# Patient Record
Sex: Male | Born: 1940 | Race: White | Hispanic: No | Marital: Married | Smoking: Former smoker
Health system: Southern US, Community
[De-identification: ages and names within clinical notes are randomized; demographics above are authoritative.]

## PROBLEM LIST (undated history)

## (undated) DIAGNOSIS — J45909 Unspecified asthma, uncomplicated: Secondary | ICD-10-CM

## (undated) DIAGNOSIS — I10 Essential (primary) hypertension: Secondary | ICD-10-CM

## (undated) DIAGNOSIS — M1990 Unspecified osteoarthritis, unspecified site: Secondary | ICD-10-CM

## (undated) DIAGNOSIS — M545 Low back pain: Secondary | ICD-10-CM

## (undated) DIAGNOSIS — E785 Hyperlipidemia, unspecified: Secondary | ICD-10-CM

## (undated) DIAGNOSIS — K219 Gastro-esophageal reflux disease without esophagitis: Secondary | ICD-10-CM

## (undated) DIAGNOSIS — Z86010 Personal history of colonic polyps: Secondary | ICD-10-CM

## (undated) DIAGNOSIS — R51 Headache: Secondary | ICD-10-CM

## (undated) DIAGNOSIS — E119 Type 2 diabetes mellitus without complications: Secondary | ICD-10-CM

## (undated) HISTORY — DX: Hyperlipidemia, unspecified: E78.5

## (undated) HISTORY — PX: TOTAL KNEE ARTHROPLASTY: SHX125

## (undated) HISTORY — DX: Low back pain: M54.5

## (undated) HISTORY — PX: COLONOSCOPY: SHX174

## (undated) HISTORY — PX: JOINT REPLACEMENT: SHX530

## (undated) HISTORY — DX: Personal history of colonic polyps: Z86.010

## (undated) HISTORY — DX: Essential (primary) hypertension: I10

## (undated) HISTORY — DX: Unspecified asthma, uncomplicated: J45.909

## (undated) HISTORY — DX: Morbid (severe) obesity due to excess calories: E66.01

## (undated) HISTORY — DX: Unspecified osteoarthritis, unspecified site: M19.90

---
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
## 1974-10-01 HISTORY — PX: TRIGEMINAL NERVE DECOMPRESSION: SHX2579

## 2005-01-15 ENCOUNTER — Encounter: Admission: RE | Admit: 2005-01-15 | Discharge: 2005-01-15 | Payer: Self-pay | Admitting: Sports Medicine

## 2005-02-15 ENCOUNTER — Encounter: Admission: RE | Admit: 2005-02-15 | Discharge: 2005-02-15 | Payer: Self-pay | Admitting: Sports Medicine

## 2005-03-05 ENCOUNTER — Encounter: Admission: RE | Admit: 2005-03-05 | Discharge: 2005-03-05 | Payer: Self-pay | Admitting: Sports Medicine

## 2005-03-26 ENCOUNTER — Encounter: Admission: RE | Admit: 2005-03-26 | Discharge: 2005-03-26 | Payer: Self-pay | Admitting: Sports Medicine

## 2008-08-17 ENCOUNTER — Encounter: Admission: RE | Admit: 2008-08-17 | Discharge: 2008-08-17 | Payer: Self-pay | Admitting: Orthopedic Surgery

## 2009-04-26 ENCOUNTER — Inpatient Hospital Stay (HOSPITAL_COMMUNITY): Admission: RE | Admit: 2009-04-26 | Discharge: 2009-04-28 | Payer: Self-pay | Admitting: Orthopedic Surgery

## 2009-10-01 HISTORY — PX: LUMBAR LAMINECTOMY: SHX95

## 2009-11-02 ENCOUNTER — Inpatient Hospital Stay (HOSPITAL_COMMUNITY): Admission: RE | Admit: 2009-11-02 | Discharge: 2009-11-04 | Payer: Self-pay | Admitting: Specialist

## 2010-06-19 ENCOUNTER — Ambulatory Visit: Payer: Self-pay | Admitting: Internal Medicine

## 2010-06-19 DIAGNOSIS — Z86010 Personal history of colonic polyps: Secondary | ICD-10-CM

## 2010-06-19 DIAGNOSIS — M1990 Unspecified osteoarthritis, unspecified site: Secondary | ICD-10-CM

## 2010-06-19 DIAGNOSIS — J45909 Unspecified asthma, uncomplicated: Secondary | ICD-10-CM

## 2010-06-19 DIAGNOSIS — R51 Headache: Secondary | ICD-10-CM

## 2010-06-19 DIAGNOSIS — M545 Low back pain, unspecified: Secondary | ICD-10-CM

## 2010-06-19 DIAGNOSIS — E785 Hyperlipidemia, unspecified: Secondary | ICD-10-CM

## 2010-06-19 DIAGNOSIS — R519 Headache, unspecified: Secondary | ICD-10-CM | POA: Insufficient documentation

## 2010-06-19 DIAGNOSIS — I10 Essential (primary) hypertension: Secondary | ICD-10-CM

## 2010-06-19 HISTORY — DX: Essential (primary) hypertension: I10

## 2010-06-19 HISTORY — DX: Hyperlipidemia, unspecified: E78.5

## 2010-06-19 HISTORY — DX: Low back pain, unspecified: M54.50

## 2010-06-19 HISTORY — DX: Unspecified osteoarthritis, unspecified site: M19.90

## 2010-06-19 HISTORY — DX: Personal history of colonic polyps: Z86.010

## 2010-06-19 LAB — CONVERTED CEMR LAB
Alkaline Phosphatase: 71 units/L (ref 39–117)
Basophils Relative: 0.4 % (ref 0.0–3.0)
CO2: 29 meq/L (ref 19–32)
Chloride: 100 meq/L (ref 96–112)
Cholesterol: 165 mg/dL (ref 0–200)
Creatinine, Ser: 1 mg/dL (ref 0.4–1.5)
Eosinophils Absolute: 0.2 10*3/uL (ref 0.0–0.7)
Eosinophils Relative: 1.8 % (ref 0.0–5.0)
Glucose, Bld: 99 mg/dL (ref 70–99)
HCT: 40.6 % (ref 39.0–52.0)
Lymphocytes Relative: 12.2 % (ref 12.0–46.0)
Lymphs Abs: 1.4 10*3/uL (ref 0.7–4.0)
MCV: 93.9 fL (ref 78.0–100.0)
Neutro Abs: 8.7 10*3/uL — ABNORMAL HIGH (ref 1.4–7.7)
RBC: 4.32 M/uL (ref 4.22–5.81)

## 2010-08-18 ENCOUNTER — Encounter: Payer: Self-pay | Admitting: Internal Medicine

## 2010-09-18 ENCOUNTER — Ambulatory Visit: Payer: Self-pay | Admitting: Internal Medicine

## 2010-10-31 NOTE — Medication Information (Signed)
Summary: No PA required for Simvastatin  No PA required for Simvastatin   Imported By: Maryln Gottron 08/23/2010 12:46:05  _____________________________________________________________________  External Attachment:    Type:   Image     Comment:   External Document

## 2010-10-31 NOTE — Assessment & Plan Note (Signed)
Summary: new to est//cm   Vital Signs:  Patient profile:   70 year old male Height:      69 inches Weight:      269 pounds Temp:     98.3 degrees F oral Pulse rate:   80 / minute Pulse rhythm:   regular Resp:     18 per minute BP sitting:   160 / 80  (left arm) Cuff size:   large  Vitals Entered By: Duard Brady LPN (June 19, 2010 1:58 PM) CC: new to establish Is Patient Diabetic? No   CC:  new to establish.  History of Present Illness: 70 year old patient who is in today to establish with our practice.  Medical problems include  morbid.  Obesity, arthritis, hypertension, and dyslipidemia.  He also is a history of colonic polyps.  Here for Medicare AWV:  1.   Risk factors based on Past M, S, F history:  cardiovascular risk factors include hypertension, dyslipidemia 2.   Physical Activities: plays golf once or twice weekly 3.   Depression/mood: history depression, or mood disorder 4.   Hearing: no hearing deficits 5.   ADL's: independent in all aspects living 6.   Fall Risk: low 7.   Home Safety: no problems  identifying 8.   Height, weight, &visual acuity:height and weight stable.  No change in visual acuity 9.   Counseling: weight loss, more regular exercise encouraged 10.   Labs ordered based on risk factors: laboratory profile, including PSA will be reviewed 11.           Referral Coordination- follow GI in 4 years for follow-up colonoscopy 12.           Care Plan- heart healthy diet, exercise, weight loss.  All encouraged 13.            Cognitive Assessment- alert and oriented, with normal affect; no memory dysfunction, able to handle all executive functions without difficulty   Allergies (verified): 1)  ! Morphine  Past History:  Past Medical History: Asthma Headache Hyperlipidemia Hypertension Low back pain morbid obesity Colonic polyps, hx of Osteoarthritis  Past Surgical History: Total knee replacement 2010 Charlann Boxer) Lumbar laminectomy 2011  ([facility]) Tonsillectomy  Family History: Reviewed history and no changes required. Father died age 72 CAD Mother died 47s SDAT 4 B, 1 S- one died accidental  Social History: Reviewed history and no changes required. Retired Psychologist, clinical  Married  Review of Systems  The patient denies anorexia, fever, weight loss, weight gain, vision loss, decreased hearing, hoarseness, chest pain, syncope, dyspnea on exertion, peripheral edema, prolonged cough, headaches, hemoptysis, abdominal pain, melena, hematochezia, severe indigestion/heartburn, hematuria, incontinence, genital sores, muscle weakness, suspicious skin lesions, transient blindness, difficulty walking, depression, unusual weight change, abnormal bleeding, enlarged lymph nodes, angioedema, breast masses, and testicular masses.    Physical Exam  General:   morbidly obese; blood pressure 160/74 Head:  Normocephalic and atraumatic without obvious abnormalities. No apparent alopecia or balding. Eyes:  No corneal or conjunctival inflammation noted. EOMI. Perrla. Funduscopic exam benign, without hemorrhages, exudates or papilledema. Vision grossly normal. Ears:  External ear exam shows no significant lesions or deformities.  Otoscopic examination reveals clear canals, tympanic membranes are intact bilaterally without bulging, retraction, inflammation or discharge. Hearing is grossly normal bilaterally. Nose:  External nasal examination shows no deformity or inflammation. Nasal mucosa are pink and moist without lesions or exudates. Mouth:  Oral mucosa and oropharynx without lesions or exudates.  Teeth in good repair. Neck:  No deformities,  masses, or tenderness noted. Chest Wall:  No deformities, masses, tenderness or gynecomastia noted. Breasts:  No masses or gynecomastia noted Lungs:  Normal respiratory effort, chest expands symmetrically. Lungs are clear to auscultation, no crackles or wheezes. Heart:  Normal rate and regular rhythm. S1 and  S2 normal without gallop, murmur, click, rub or other extra sounds. Abdomen:  Bowel sounds positive,abdomen soft and non-tender without masses, organomegaly  ventral hernia Rectal:  No external abnormalities noted. Normal sphincter tone. No rectal masses or tenderness. Genitalia:  Testes bilaterally descended without nodularity, tenderness or masses. No scrotal masses or lesions. No penis lesions or urethral discharge. Prostate:   2+ enlarged.  1+ enlarged and 2+ enlarged.   Msk:  No deformity or scoliosis noted of thoracic or lumbar spine.   Pulses:  R and L carotid,radial,femoral,dorsalis pedis and posterior tibial pulses are full and equal bilaterally Extremities:  No clubbing, cyanosis, edema, or deformity noted with normal full range of motion of all joints.   Neurologic:  No cranial nerve deficits noted. Station and gait are normal. Plantar reflexes are down-going bilaterally. DTRs are symmetrical throughout. Sensory, motor and coordinative functions appear intact. Skin:  Intact without suspicious lesions or rashes Cervical Nodes:  No lymphadenopathy noted Axillary Nodes:  No palpable lymphadenopathy Inguinal Nodes:  No significant adenopathy Psych:  Cognition and judgment appear intact. Alert and cooperative with normal attention span and concentration. No apparent delusions, illusions, hallucinations   Impression & Recommendations:  Problem # 1:  Preventive Health Care (ICD-V70.0)  Orders: Medicare -1st Annual Wellness Visit [phone]) Venipuncture (57846) TLB-BMP (Basic Metabolic Panel-BMET) (80048-METABOL) TLB-CBC Platelet - w/Differential (85025-CBCD) TLB-Hepatic/Liver Function Pnl (80076-HEPATIC) TLB-TSH (Thyroid Stimulating Hormone) (84443-TSH) TLB-Cholesterol, Total (82465-CHO) TLB-PSA (Prostate Specific Antigen) (84153-PSA) Specimen Handling (96295)  Complete Medication List: 1)  Lisinopril 40 Mg Tabs (Lisinopril) .... Qd 2)  Simvastatin 40 Mg Tabs (Simvastatin) ....  Qd 3)  Diltiazem Hcl 120 Mg Tabs (Diltiazem hcl) .... 2 by mouth qam and 1po qpm 4)  Nabumetone 750 Mg Tabs (Nabumetone) .Marland Kitchen.. 1 by mouth bid 5)  Multivitamins Tabs (Multiple vitamin) .... Qd 6)  Hydrochlorothiazide 25 Mg Tabs (Hydrochlorothiazide) .... One daily  Patient Instructions: 1)  Please schedule a follow-up appointment in 3 months. 2)  Limit your Sodium (Salt). 3)  It is important that you exercise regularly at least 20 minutes 5 times a week. If you develop chest pain, have severe difficulty breathing, or feel very tired , stop exercising immediately and seek medical attention. 4)  You need to lose weight. Consider a lower calorie diet and regular exercise.  5)  Check your Blood Pressure regularly. If it is above:  150/90 you should make an appointment. Prescriptions: HYDROCHLOROTHIAZIDE 25 MG TABS (HYDROCHLOROTHIAZIDE) one daily  #90 x 4   Entered and Authorized by:   Gordy Savers  MD   Signed by:   Gordy Savers  MD on 06/19/2010   Method used:   Print then Give to Patient   RxID:   [id number] METOPROLOL TARTRATE 50 MG TABS (METOPROLOL TARTRATE) qd  #90 x 4   Entered and Authorized by:   Gordy Savers  MD   Signed by:   Gordy Savers  MD on 06/19/2010   Method used:   Print then Give to Patient   RxID:   [id number] NABUMETONE 750 MG TABS (NABUMETONE) 1 by mouth bid  #180 x 4   Entered and Authorized by:   Gordy Savers  MD   Signed  by:   Gordy Savers  MD on 06/19/2010   Method used:   Print then Give to Patient   RxID:   [id number] DILTIAZEM HCL 120 MG TABS (DILTIAZEM HCL) 2 by mouth qam and 1po qpm  #270 x 5   Entered and Authorized by:   Gordy Savers  MD   Signed by:   Gordy Savers  MD on 06/19/2010   Method used:   Print then Give to Patient   RxID:   [id number] SIMVASTATIN 40 MG TABS (SIMVASTATIN) qd  #90 x 4   Entered and Authorized by:   Gordy Savers  MD   Signed by:   Gordy Savers  MD on 06/19/2010   Method used:   Print then Give to Patient   RxID:   [id number] LISINOPRIL 40 MG TABS (LISINOPRIL) qd  #90 x 4   Entered and Authorized by:   Gordy Savers  MD   Signed by:   Gordy Savers  MD on 06/19/2010   Method used:   Print then Give to Patient   RxID:   [id number]    Immunization History:  Influenza Immunization History:    Influenza:  Historical (06/01/2010)

## 2010-11-02 NOTE — Assessment & Plan Note (Signed)
Summary: 3 month rov/njr   Vital Signs:  Patient profile:   70 year old male Weight:      269 pounds Temp:     98.4 degrees F oral BP sitting:   140 / 70  (left arm) Cuff size:   large  Vitals Entered By: Duard Brady LPN (September 18, 2010 1:00 PM) CC: 3 mos rov - doing well Is Patient Diabetic? No   CC:  3 mos rov - doing well.  History of Present Illness: 70 year old patient who is seen today for follow-up.  He has a history of treated hypertension and dyslipidemia.  Laboratory studies were checked 3 months ago, and these were again reviewed.  His blood pressure remains well controlled and he does monitor readings at home.  He is asymptomatic.  Allergies: 1)  ! Morphine  Past History:  Past Medical History: Reviewed history from 06/19/2010 and no changes required. Asthma Headache Hyperlipidemia Hypertension Low back pain morbid obesity Colonic polyps, hx of Osteoarthritis  Review of Systems  The patient denies anorexia, fever, weight loss, weight gain, vision loss, decreased hearing, hoarseness, chest pain, syncope, dyspnea on exertion, peripheral edema, prolonged cough, headaches, hemoptysis, abdominal pain, melena, hematochezia, severe indigestion/heartburn, hematuria, incontinence, genital sores, muscle weakness, suspicious skin lesions, transient blindness, difficulty walking, depression, unusual weight change, abnormal bleeding, enlarged lymph nodes, angioedema, breast masses, and testicular masses.    Physical Exam  General:  overweight-appearing.  130/64overweight-appearing.   Head:  Normocephalic and atraumatic without obvious abnormalities. No apparent alopecia or balding. Mouth:  Oral mucosa and oropharynx without lesions or exudates.  Teeth in good repair. Neck:  No deformities, masses, or tenderness noted. Lungs:  Normal respiratory effort, chest expands symmetrically. Lungs are clear to auscultation, no crackles or wheezes. Heart:  Normal rate and  regular rhythm. S1 and S2 normal without gallop, murmur, click, rub or other extra sounds. Abdomen:  Bowel sounds positive,abdomen soft and non-tender without masses, organomegaly or hernias noted. Msk:  No deformity or scoliosis noted of thoracic or lumbar spine.   Pulses:  R and L carotid,radial,femoral,dorsalis pedis and posterior tibial pulses are full and equal bilaterally Extremities:  No clubbing, cyanosis, edema, or deformity noted with normal full range of motion of all joints.     Impression & Recommendations:  Problem # 1:  HYPERTENSION (ICD-401.9)  His updated medication list for this problem includes:    Lisinopril 40 Mg Tabs (Lisinopril) ..... Qd    Diltiazem Hcl 120 Mg Tabs (Diltiazem hcl) .Marland Kitchen... 2 by mouth qam and 1po qpm    Hydrochlorothiazide 25 Mg Tabs (Hydrochlorothiazide) ..... One daily  His updated medication list for this problem includes:    Lisinopril 40 Mg Tabs (Lisinopril) ..... Qd    Diltiazem Hcl 120 Mg Tabs (Diltiazem hcl) .Marland Kitchen... 2 by mouth qam and 1po qpm    Hydrochlorothiazide 25 Mg Tabs (Hydrochlorothiazide) ..... One daily  Problem # 2:  HYPERLIPIDEMIA (ICD-272.4)  His updated medication list for this problem includes:    Simvastatin 40 Mg Tabs (Simvastatin) ..... Qd  His updated medication list for this problem includes:    Simvastatin 40 Mg Tabs (Simvastatin) ..... Qd  Complete Medication List: 1)  Lisinopril 40 Mg Tabs (Lisinopril) .... Qd 2)  Simvastatin 40 Mg Tabs (Simvastatin) .... Qd 3)  Diltiazem Hcl 120 Mg Tabs (Diltiazem hcl) .... 2 by mouth qam and 1po qpm 4)  Nabumetone 750 Mg Tabs (Nabumetone) .Marland Kitchen.. 1 by mouth bid 5)  Multivitamins Tabs (Multiple vitamin) .Marland KitchenMarland KitchenMarland Kitchen  Qd 6)  Hydrochlorothiazide 25 Mg Tabs (Hydrochlorothiazide) .... One daily  Patient Instructions: 1)  Please schedule a follow-up appointment in 6 months. 2)  Limit your Sodium (Salt). 3)  It is important that you exercise regularly at least 20 minutes 5 times a week. If you  develop chest pain, have severe difficulty breathing, or feel very tired , stop exercising immediately and seek medical attention. 4)  You need to lose weight. Consider a lower calorie diet and regular exercise.  5)  Check your Blood Pressure regularly. If it is above: 150/90 you should make an appointment.   Orders Added: 1)  Est. Patient Level III [95621]

## 2010-12-17 LAB — COMPREHENSIVE METABOLIC PANEL
ALT: 19 U/L (ref 0–53)
AST: 17 U/L (ref 0–37)
BUN: 21 mg/dL (ref 6–23)
Calcium: 9.3 mg/dL (ref 8.4–10.5)
Chloride: 101 mEq/L (ref 96–112)
Creatinine, Ser: 1.01 mg/dL (ref 0.4–1.5)
GFR calc Af Amer: >60 mL/min (ref >60)
Potassium: 4.4 mEq/L (ref 3.5–5.1)
Sodium: 140 mEq/L (ref 135–145)
Total Bilirubin: 0.9 mg/dL (ref 0.3–1.2)
Total Protein: 7 g/dL (ref 6.0–8.3)

## 2010-12-17 LAB — URINALYSIS, ROUTINE W REFLEX MICROSCOPIC
Hgb urine dipstick: NEGATIVE
Protein, ur: 100 mg/dL — AB
Specific Gravity, Urine: 1.027 (ref 1.005–1.030)

## 2010-12-17 LAB — CBC
MCHC: 35.1 g/dL (ref 30.0–36.0)
RDW: 12.3 % (ref 11.5–15.5)

## 2010-12-17 LAB — DIFFERENTIAL
Basophils Relative: 1 % (ref 0–1)
Lymphocytes Relative: 12 % (ref 12–46)
Lymphs Abs: 1.1 10*3/uL (ref 0.7–4.0)
Monocytes Absolute: 0.7 10*3/uL (ref 0.1–1.0)
Neutro Abs: 6.4 10*3/uL (ref 1.7–7.7)
Neutrophils Relative %: 76 % (ref 43–77)

## 2010-12-17 LAB — PROTIME-INR
INR: 1.03 (ref 0.00–1.49)
Prothrombin Time: 13.4 seconds (ref 11.6–15.2)

## 2010-12-17 LAB — URINE MICROSCOPIC-ADD ON

## 2010-12-17 LAB — APTT: aPTT: 28 seconds (ref 24–37)

## 2010-12-20 LAB — BASIC METABOLIC PANEL
CO2: 29 mEq/L (ref 19–32)
Chloride: 102 mEq/L (ref 96–112)
GFR calc Af Amer: >60 mL/min (ref >60)
GFR calc non Af Amer: >60 mL/min (ref >60)
Potassium: 4.4 mEq/L (ref 3.5–5.1)

## 2010-12-20 LAB — CBC
HCT: 34.6 % — ABNORMAL LOW (ref 39.0–52.0)
MCHC: 34.9 g/dL (ref 30.0–36.0)
MCV: 93.3 fL (ref 78.0–100.0)
RBC: 3.71 MIL/uL — ABNORMAL LOW (ref 4.22–5.81)
RDW: 13.2 % (ref 11.5–15.5)

## 2010-12-20 LAB — TYPE AND SCREEN

## 2011-01-07 LAB — CBC
HCT: 32 % — ABNORMAL LOW (ref 39.0–52.0)
HCT: 34.1 % — ABNORMAL LOW (ref 39.0–52.0)
HCT: 41.7 % (ref 39.0–52.0)
Hemoglobin: 11.1 g/dL — ABNORMAL LOW (ref 13.0–17.0)
Hemoglobin: 11.8 g/dL — ABNORMAL LOW (ref 13.0–17.0)
MCHC: 34.7 g/dL (ref 30.0–36.0)
MCV: 92.1 fL (ref 78.0–100.0)
Platelets: 191 10*3/uL (ref 150–400)
Platelets: 221 10*3/uL (ref 150–400)
RBC: 3.71 MIL/uL — ABNORMAL LOW (ref 4.22–5.81)
RBC: 4.55 MIL/uL (ref 4.22–5.81)
RDW: 12.8 % (ref 11.5–15.5)
RDW: 13 % (ref 11.5–15.5)
WBC: 9.8 10*3/uL (ref 4.0–10.5)

## 2011-01-07 LAB — DIFFERENTIAL
Basophils Relative: 0 % (ref 0–1)
Eosinophils Absolute: 0.2 10*3/uL (ref 0.0–0.7)
Eosinophils Relative: 3 % (ref 0–5)
Lymphocytes Relative: 12 % (ref 12–46)
Neutro Abs: 7.3 10*3/uL (ref 1.7–7.7)
Neutrophils Relative %: 75 % (ref 43–77)

## 2011-01-07 LAB — BASIC METABOLIC PANEL
BUN: 14 mg/dL (ref 6–23)
BUN: 25 mg/dL — ABNORMAL HIGH (ref 6–23)
CO2: 28 mEq/L (ref 19–32)
CO2: 31 mEq/L (ref 19–32)
Calcium: 8.5 mg/dL (ref 8.4–10.5)
Calcium: 9.6 mg/dL (ref 8.4–10.5)
GFR calc Af Amer: >60 mL/min (ref >60)
GFR calc Af Amer: >60 mL/min (ref >60)
GFR calc non Af Amer: >60 mL/min (ref >60)
Glucose, Bld: 100 mg/dL — ABNORMAL HIGH (ref 70–99)
Glucose, Bld: 130 mg/dL — ABNORMAL HIGH (ref 70–99)
Glucose, Bld: 164 mg/dL — ABNORMAL HIGH (ref 70–99)
Potassium: 4.4 mEq/L (ref 3.5–5.1)
Potassium: 4.7 mEq/L (ref 3.5–5.1)
Sodium: 136 mEq/L (ref 135–145)
Sodium: 138 mEq/L (ref 135–145)
Sodium: 143 mEq/L (ref 135–145)

## 2011-01-07 LAB — URINALYSIS, ROUTINE W REFLEX MICROSCOPIC
Bilirubin Urine: NEGATIVE
Ketones, ur: NEGATIVE mg/dL
Leukocytes, UA: NEGATIVE
Nitrite: NEGATIVE
Protein, ur: 100 mg/dL — AB

## 2011-01-07 LAB — TYPE AND SCREEN
ABO/RH(D): O POS
Antibody Screen: NEGATIVE

## 2011-01-07 LAB — PROTIME-INR: Prothrombin Time: 13.4 seconds (ref 11.6–15.2)

## 2011-01-07 LAB — APTT: aPTT: 27 seconds (ref 24–37)

## 2011-02-13 NOTE — H&P (Signed)
NAMEQUINTON, Julian Mcdonald            ACCOUNT NO.:  [id number]   MEDICAL RECORD NO.:  [id number]          PATIENT TYPE:  INP   LOCATION:  NA                           FACILITY:  [facility]   PHYSICIAN:  Madlyn Frankel. Charlann Boxer, M.D.  DATE OF BIRTH:  [birth year]   DATE OF ADMISSION:  04/21/2009  DATE OF DISCHARGE:                              HISTORY & PHYSICAL   PROCEDURE:  Right total knee replacement.   CHIEF COMPLAINTS:  Right knee pain.   HISTORY OF PRESENT ILLNESS:  A 70 year old male with a history of right  knee pain secondary to osteoarthritis, been refractory to all  conservative treatment.   PRIMARY CARE PHYSICIAN:  Dr. Haskel Schroeder with [facility].   PAST MEDICAL HISTORY:  1. Osteoarthritis.  2. Hypertension.   PAST SURGERIES:  1. Tonsillectomy.  2. Trigeminal neuralgia surgery.   FAMILY HISTORY:  Noncontributory.   SOCIAL HISTORY:  Married, nonsmoker.  Primary caregiver will be his wife  Jamesetta So postoperatively.   DRUG ALLERGIES:  MORPHINE.   MEDICATIONS:  1. Multivitamin Centrum Silver p.o. daily.  2. Lisinopril 40 mg p.o. daily.  3. Simvastatin 40 mg p.o. daily.  4. Bystolic 5 mg p.o. daily.  5. Relafen 750 mg p.o. b.i.d.  6. Arthritis Pain Reliever two p.o. daily p.r.n.  7. Hydrocodone 10/660 one p.o. q.6 h p.r.n.   REVIEW OF SYSTEMS:  See HPI.   PHYSICAL EXAMINATION:  VITAL SIGNS:  Pulse 72, respirations 16, blood  pressure 144/90.  GENERAL:  Awake, alert and oriented.  HEENT: Normocephalic.  NECK:  Supple.  No carotid bruits.  CHEST/LUNGS:  Sounds clear to auscultation bilaterally.  BREASTS:  Deferred.  HEART:  S1-S2 distinct.  ABDOMEN:  Soft, bowel sounds present.  PELVIS:  Stable.  GENITOURINARY:  Deferred.  EXTREMITIES:  Right knee has increased pain with weightbearing.  SKIN:  No cellulitis.  NEUROLOGIC:  Intact distal sensibilities.   LABORATORY DATA:  Labs, EKG, chest x-ray all pending presurgical  testing.   IMPRESSION:  Right knee  osteoarthritis.   PLAN:  Plan of action right total knee replacement by Dr. Charlann Boxer at [facility] April 26, 2009.  Risks, complications were discussed.   Postoperative medications were provided including aspirin for DVT  prophylaxis.  He will also be taking Mobic for postoperative swelling  and pain.     ______________________________  Yetta Glassman. [address], [place]      Madlyn Frankel. Charlann Boxer, M.D.  Electronically Signed    BLM/MEDQ  D:  04/20/2009  T:  04/20/2009  Job:  [id number]   cc:   Jocelyn Lamer D. Pecola Leisure, M.D.  Fax: [phone]

## 2011-02-13 NOTE — Op Note (Signed)
NAMEJAKWAN, SALLY            ACCOUNT NO.:  [id number]   MEDICAL RECORD NO.:  [id number]          PATIENT TYPE:  INP   LOCATION:  0003                         FACILITY:  [facility]   PHYSICIAN:  Madlyn Frankel. Charlann Boxer, M.D.  DATE OF BIRTH:  Feb 12, 1941   DATE OF PROCEDURE:  04/26/2009  DATE OF DISCHARGE:                               OPERATIVE REPORT   PREOPERATIVE DIAGNOSIS:  Right knee osteoarthritis.   POSTOPERATIVE DIAGNOSIS:  Right knee osteoarthritis.   PROCEDURE:  Right total knee replacement.   COMPONENTS USED:  DePuy rotating platform posterior stabilized knee  system with a size 5 femur, 5 tibial tray, 10 mm insert and a 41  patellar button.   SURGEON:  Madlyn Frankel. Charlann Boxer, M.D.   ASSISTANT:  Surgical techs.   ANESTHESIA:  Spinal.   DRAINS:  One Hemovac.   COMPLICATIONS:  None.   TOURNIQUET TIME:  50 minutes at 250 mmHg.   FINDINGS:  As above.   SPECIMENS:  None.   INDICATIONS FOR PROCEDURE:  Mr. Friesen is a 70 year old gentleman who  had been following in the office for advanced bilateral knee  osteoarthritis.  He had failed conservative measures and wished to  proceed with arthroplasty at this point.  The risks of infection, DVT,  stiffness and the need for revision surgery were all discussed and  reviewed.  Consent was obtained for the benefit of pain relief.   PROCEDURE IN DETAIL:  The patient was brought to the operative theater.  Once adequate anesthesia, preoperative antibiotics, Ancef administered,  the patient was positioned supine with a proximal thigh tourniquet  placed on the right lower extremity.  The right lower extremity was then  pre-scrubbed, prepped and draped in a sterile fashion.  A timeout was  performed identifying the patient, planned procedure and extremity.   The right foot was placed into the  foot holder and the leg was  exsanguinated and the tourniquet elevated to 250 mmHg.  The leg was  flexed and a midline incision was made.   Following a median arthrotomy  and initial debridement, attention was directed to the patella.  Precut  measurement was 24-25 mm.  I resected down to 14-15 mm and used a 41  patellar button.  The lug holes were drilled and metal shim placed.   Attention was now directed to the femur.  The femoral canal was opened  with a drill, irrigated to prevent fat emboli and intramedullary rod was  passed into 5 degrees of valgus.  I resected 12 mm of bone off the  distal femur due to flexion contracture.   Attention was now directed to the tibia.  The tibia was subluxated  anteriorly and using extramedullary guide, I resected 10 mm of bone off  the proximal lateral tibia.  I then checked to find that the knee came  to extension with the 10 mm insert.  The guidepin was removed.  We then  checked the cut surface to make sure that in the coronal plane that the  cut was perpendicular and it was with the alignment rod.  I then sized  the femur  to be a size 5, fit the rotation based on the proximal tibia  cut using a C clamp and a rotation block.  There was an anterior  reference.  The four-in-one cutting block was then placed.  Anterior,  posterior and chamfer cuts were then made without difficulty or  notching.   The final box cut was made off the lateral aspect of the distal femur.  At this point, we returned back to the tibia.  The tibia cut surface  seemed to fit best with a size 5 tibial tray even after I removed medial  osteophytes further decompressing the MCL, in this varus knee.  Once the  tibial tray was pinned into position, it was drilled, keel punched and a  trial reduction was carried out with a 5 femur, 5 tibia, 10-mm insert  and 41 patellar button which tracked through the trochlea without  application or pressure.   At this point, the trial components were removed.  The final components  were opened.  Final debridements within the posterior aspect of the knee  carried out as  necessary.  Synovial capsule junction was injected with  quarter-percent Marcaine with epinephrine and Toradol.  Once the knee  was irrigated with normal saline solution, the cement was mixed.   The final components were cemented into position.  The 10 mm insert was  inserted and extruded cement was removed.  Once the cement had cured,  excessive cement was removed throughout the knee and the final 10-mm  insert to match the 5 femur was inserted.   At this point, the tourniquet was let down at 50 minutes at 250 mmHg.  A  medium Hemovac drain was placed deep.  We re-irrigated the knee with  normal saline solution.   The extensor mechanism was then reapproximated using #1 Vicryl with the  knee in flexion.  The remaining wound was closed with 2-0 Vicryl and a  running 4-0 Monocryl.  The knee was cleaned, dried and dressed sterilely  with Steri-Strips and a bulky sterile wrap.  He was then brought to the  recovery room in stable condition tolerating the procedure well.      Madlyn Frankel Charlann Boxer, M.D.  Electronically Signed     MDO/MEDQ  D:  04/26/2009  T:  04/26/2009  Job:  [id number]

## 2011-02-16 NOTE — Discharge Summary (Signed)
Julian Mcdonald, Julian Mcdonald            ACCOUNT NO.:  [id number]   MEDICAL RECORD NO.:  [id number]          PATIENT TYPE:  INP   LOCATION:  1605                         [facility]ACILITY:  [facility]   PHYSICIAN:  Madlyn Frankel. Charlann Boxer, M.D.  DATE OF BIRTH:  May 06, 1941   DATE OF ADMISSION:  04/26/2009  DATE OF DISCHARGE:  04/28/2009                               DISCHARGE SUMMARY   ADMISSION DIAGNOSES:  1,  Osteoarthritis.  1. Hypertension.   DISCHARGE DIAGNOSES:  1,  Osteoarthritis.  1. Hypertension.   HISTORY OF PRESENT ILLNESS:  A 70 year old male with a history of right  knee pain secondary to osteoarthritis.   CONSULTATION:  None.   PROCEDURE:  Right total knee replaced by surgeon Dr. Durene Romans.   LABORATORY DATA:  CBC final reading:  White blood cell 9.5, hemoglobin  11.1, hematocrit 32, platelets 191,000.  Metabolic:  Sodium 138,  potassium 4.7, BUN 14, creatinine 0.93, glucose 130.   HOSPITAL COURSE:  The patient admitted to hospital, underwent right  total knee replacement.  Tolerated procedure well.  His stay was  unremarkable.  He remained hemodynamically orthopedically stable.  Wound  dressing was changed after day #2.  No significant drainage from wound.  He had improving quad function was weightbearing as tolerated with  physical therapy and by day #2, met all criteria for discharge home with  home health care PT.   DISCHARGE DISPOSITION:  Discharged home stable, improved condition with  home health care PT.   DISCHARGE DIET:  Heart-healthy.   WOUND CARE:  Keep dry.   DISCHARGE MEDICATIONS:  1. Oxycodone 5 mg 1-3 tabs q. 3-5 p.r.n. pain.  2. Robaxin 500 mg p.o. q.6 p.r.n. muscle spasm pain.  3. Aspirin 325 mg p.o. b.i.d. for 6 weeks.  4. Iron 325 mg t.i.d. x2 weeks.  5. Colace 100 mg p.o. b.i.d. p.r.n.  6. Multivitamin daily.  7. Bioflex daily.  8. Bystolic 5 mg one p.o. daily.  9. Lisinopril 40 mg one p.o. daily.  10.Simvastatin 40 mg one p.o. daily.  11.Celebrex 2 mg  one p.o. b.i.d. x2 weeks.   DISCHARGE FOLLOWUP:  Follow up with Dr. Charlann Boxer at phone [phone] in 2  weeks for wound check.   DISCHARGE PHYSICAL THERAPY:  Home health care PT with goals in the range  of 0-120.     ______________________________  Julian Mcdonald, Julian Mcdonald      Madlyn Frankel. Charlann Boxer, M.D.  Electronically Signed    BLM/MEDQ  D:  05/16/2009  T:  05/16/2009  Job:  [id number]   cc:   Jocelyn Lamer D. Pecola Leisure, M.D.  Fax: [phone]

## 2011-03-02 ENCOUNTER — Encounter: Payer: Self-pay | Admitting: Internal Medicine

## 2011-03-05 ENCOUNTER — Encounter: Payer: Self-pay | Admitting: Internal Medicine

## 2011-03-05 ENCOUNTER — Ambulatory Visit (INDEPENDENT_AMBULATORY_CARE_PROVIDER_SITE_OTHER): Payer: Medicare Other | Admitting: Internal Medicine

## 2011-03-05 DIAGNOSIS — M1990 Unspecified osteoarthritis, unspecified site: Secondary | ICD-10-CM

## 2011-03-05 DIAGNOSIS — I10 Essential (primary) hypertension: Secondary | ICD-10-CM

## 2011-03-05 DIAGNOSIS — E785 Hyperlipidemia, unspecified: Secondary | ICD-10-CM

## 2011-03-05 NOTE — Patient Instructions (Signed)

## 2011-03-05 NOTE — Progress Notes (Signed)
  Subjective:    Patient ID: Julian Mcdonald, male    DOB: [birth year], 70 y.o.   MRN: [id number]  HPI  70 year old patient who is seen today for followup. He has a history of hypertension dyslipidemia and osteoarthritis. Is also seen today for medical clearance for a total left knee replacement surgery. He is doing quite well. He is active with golf and does reasonably well does have some occasional low back pain as well as his chronic left knee pain. He denies any exertional chest pain or shortness of breath there is a remote history of asthma that has been stable. His medical regimen reviewed.    Review of Systems  Constitutional: Negative for fever, chills, appetite change and fatigue.  HENT: Negative for hearing loss, ear pain, congestion, sore throat, trouble swallowing, neck stiffness, dental problem, voice change and tinnitus.   Eyes: Negative for pain, discharge and visual disturbance.  Respiratory: Negative for cough, chest tightness, wheezing and stridor.   Cardiovascular: Negative for chest pain, palpitations and leg swelling.  Gastrointestinal: Negative for nausea, vomiting, abdominal pain, diarrhea, constipation, blood in stool and abdominal distention.  Genitourinary: Negative for urgency, hematuria, flank pain, discharge, difficulty urinating and genital sores.  Musculoskeletal: Negative for myalgias, back pain, joint swelling, arthralgias and gait problem.  Skin: Negative for rash.  Neurological: Negative for dizziness, syncope, speech difficulty, weakness, numbness and headaches.  Hematological: Negative for adenopathy. Does not bruise/bleed easily.  Psychiatric/Behavioral: Negative for behavioral problems and dysphoric mood. The patient is not nervous/anxious.        Objective:   Physical Exam  Constitutional: He is oriented to person, place, and time. He appears well-developed.  HENT:  Head: Normocephalic.  Right Ear: External ear normal.  Left Ear: External ear  normal.  Eyes: Conjunctivae and EOM are normal.  Neck: Normal range of motion.  Cardiovascular: Normal rate and normal heart sounds.   Pulmonary/Chest: Breath sounds normal.  Abdominal: Bowel sounds are normal.  Musculoskeletal: Normal range of motion. He exhibits no edema and no tenderness.  Neurological: He is alert and oriented to person, place, and time.  Psychiatric: He has a normal mood and affect. His behavior is normal.          Assessment & Plan:   Hypertension well controlled Osteoarthritis left knee Dyslipidemia Exogenous obesity  The patient is medically stable for orthopedic surgery. Weight loss encouraged. He denies any exertional chest pain or shortness of breath and his cardiopulmonary status is stable. No preoperative evaluation is deemed necessary. He'll return here in 6 months for an annual exam

## 2011-03-26 ENCOUNTER — Encounter (HOSPITAL_COMMUNITY): Payer: Medicare Other

## 2011-03-26 ENCOUNTER — Other Ambulatory Visit: Payer: Self-pay | Admitting: Orthopedic Surgery

## 2011-03-26 ENCOUNTER — Other Ambulatory Visit (HOSPITAL_COMMUNITY): Payer: Self-pay | Admitting: Orthopedic Surgery

## 2011-03-26 ENCOUNTER — Ambulatory Visit (HOSPITAL_COMMUNITY)
Admission: RE | Admit: 2011-03-26 | Discharge: 2011-03-26 | Disposition: A | Discharge status: Home / Self Care | Payer: Medicare Other | Source: Ambulatory Visit | Attending: Orthopedic Surgery | Admitting: Orthopedic Surgery

## 2011-03-26 DIAGNOSIS — Z01812 Encounter for preprocedural laboratory examination: Secondary | ICD-10-CM | POA: Insufficient documentation

## 2011-03-26 DIAGNOSIS — Z01818 Encounter for other preprocedural examination: Secondary | ICD-10-CM

## 2011-03-26 DIAGNOSIS — R9431 Abnormal electrocardiogram [ECG] [EKG]: Secondary | ICD-10-CM | POA: Insufficient documentation

## 2011-03-26 DIAGNOSIS — M47814 Spondylosis without myelopathy or radiculopathy, thoracic region: Secondary | ICD-10-CM | POA: Insufficient documentation

## 2011-03-26 DIAGNOSIS — M1710 Unilateral primary osteoarthritis, unspecified knee: Secondary | ICD-10-CM | POA: Insufficient documentation

## 2011-03-26 DIAGNOSIS — Z01810 Encounter for preprocedural cardiovascular examination: Secondary | ICD-10-CM | POA: Insufficient documentation

## 2011-03-26 LAB — BASIC METABOLIC PANEL WITH GFR
BUN: 26 mg/dL — ABNORMAL HIGH (ref 6–23)
CO2: 29 meq/L (ref 19–32)
Calcium: 10.3 mg/dL (ref 8.4–10.5)
Chloride: 100 meq/L (ref 96–112)
Creatinine, Ser: 1.15 mg/dL (ref 0.50–1.35)
GFR calc Af Amer: >60 mL/min
GFR calc non Af Amer: >60 mL/min
Glucose, Bld: 125 mg/dL — ABNORMAL HIGH (ref 70–99)
Potassium: 4.1 meq/L (ref 3.5–5.1)
Sodium: 139 meq/L (ref 135–145)

## 2011-03-26 LAB — URINALYSIS, ROUTINE W REFLEX MICROSCOPIC
Bilirubin Urine: NEGATIVE
Glucose, UA: NEGATIVE mg/dL
Hgb urine dipstick: NEGATIVE
Ketones, ur: NEGATIVE mg/dL
Leukocytes, UA: NEGATIVE
Nitrite: NEGATIVE
Protein, ur: 30 mg/dL — AB
Specific Gravity, Urine: 1.025 (ref 1.005–1.030)
Urobilinogen, UA: 1 mg/dL (ref 0.0–1.0)
pH: 5.5 (ref 5.0–8.0)

## 2011-03-26 LAB — URINE MICROSCOPIC-ADD ON

## 2011-03-26 LAB — CBC
MCH: 30.6 pg (ref 26.0–34.0)
MCHC: 33.8 g/dL (ref 30.0–36.0)
Platelets: 270 10*3/uL (ref 150–400)
RDW: 12.7 % (ref 11.5–15.5)

## 2011-03-26 LAB — DIFFERENTIAL
Basophils Absolute: 0.1 10*3/uL (ref 0.0–0.1)
Basophils Relative: 1 % (ref 0–1)
Eosinophils Absolute: 0.5 10*3/uL (ref 0.0–0.7)
Monocytes Relative: 9 % (ref 3–12)
Neutro Abs: 8.1 10*3/uL — ABNORMAL HIGH (ref 1.7–7.7)
Neutrophils Relative %: 73 % (ref 43–77)

## 2011-03-26 LAB — SURGICAL PCR SCREEN
MRSA, PCR: NEGATIVE
Staphylococcus aureus: POSITIVE — AB

## 2011-03-26 LAB — PROTIME-INR
INR: 1.03 (ref 0.00–1.49)
Prothrombin Time: 13.7 s (ref 11.6–15.2)

## 2011-03-26 LAB — APTT: aPTT: 31 s (ref 24–37)

## 2011-04-02 ENCOUNTER — Inpatient Hospital Stay (HOSPITAL_COMMUNITY)
Admission: RE | Admit: 2011-04-02 | Discharge: 2011-04-04 | DRG: 470 | Disposition: A | Discharge status: Home Health | Payer: Medicare Other | Source: Ambulatory Visit | Attending: Orthopedic Surgery | Admitting: Orthopedic Surgery

## 2011-04-02 DIAGNOSIS — M1710 Unilateral primary osteoarthritis, unspecified knee: Principal | ICD-10-CM | POA: Diagnosis present

## 2011-04-02 DIAGNOSIS — Z01812 Encounter for preprocedural laboratory examination: Secondary | ICD-10-CM

## 2011-04-02 DIAGNOSIS — Z96659 Presence of unspecified artificial knee joint: Secondary | ICD-10-CM

## 2011-04-02 DIAGNOSIS — I10 Essential (primary) hypertension: Secondary | ICD-10-CM | POA: Diagnosis present

## 2011-04-02 LAB — TYPE AND SCREEN: Antibody Screen: NEGATIVE

## 2011-04-03 LAB — CBC
MCHC: 35 g/dL (ref 30.0–36.0)
RDW: 12.8 % (ref 11.5–15.5)
WBC: 10.3 10*3/uL (ref 4.0–10.5)

## 2011-04-03 LAB — BASIC METABOLIC PANEL
GFR calc Af Amer: >60 mL/min (ref >60)
GFR calc non Af Amer: >60 mL/min (ref >60)
Potassium: 4.2 mEq/L (ref 3.5–5.1)
Sodium: 133 mEq/L — ABNORMAL LOW (ref 135–145)

## 2011-04-04 LAB — BASIC METABOLIC PANEL
CO2: 27 mEq/L (ref 19–32)
Chloride: 96 mEq/L (ref 96–112)
GFR calc non Af Amer: >60 mL/min (ref >60)
Glucose, Bld: 142 mg/dL — ABNORMAL HIGH (ref 70–99)
Potassium: 3.9 mEq/L (ref 3.5–5.1)
Sodium: 134 mEq/L — ABNORMAL LOW (ref 135–145)

## 2011-04-04 LAB — CBC
Hemoglobin: 11.6 g/dL — ABNORMAL LOW (ref 13.0–17.0)
RBC: 3.81 MIL/uL — ABNORMAL LOW (ref 4.22–5.81)
WBC: 10.4 10*3/uL (ref 4.0–10.5)

## 2011-04-09 NOTE — H&P (Signed)
  Julian Mcdonald, Julian Mcdonald            ACCOUNT NO.:  [id number]  MEDICAL RECORD NO.:  [id number]  LOCATION:  1605                         FACILITY:  [facility]  PHYSICIAN:  Madlyn Frankel. Charlann Boxer, M.D.  DATE OF BIRTH:  1940/10/30  DATE OF ADMISSION:  04/02/2011 DATE OF DISCHARGE:  04/04/2011                             HISTORY & PHYSICAL   ADMISSION DIAGNOSIS:  Left knee osteoarthritis.  HISTORY:  Mr. Julian Mcdonald is a 70 year old patient of mine with history of bilateral knee arthritis status post right total hip replacement in July of 2010.  He has had done very well with this over the long-term and has failed conservative measures in his left knee.  At this point, he wished to proceed with left total knee replacement.  We reviewed the risks and benefits, the hospital course and expectations and consent was obtained based off our discussion today.  PAST MEDICAL HISTORY:  Includes hypertension, osteoarthritis.  PAST SURGICAL HISTORY:  Surgical history includes right total knee replacement, tonsillectomy and trigeminal neuralgia surgery.  FAMILY HISTORY:  Negative for diabetes.  SOCIAL HISTORY:  He is married.  His wife is a patient of ours as well.  DRUG ALLERGIES:  MORPHINE.  CURRENT MEDICATIONS:  Include multivitamins, lisinopril, simvastatin, Relafen as needed, hydrocodone as needed, Bystolic.  PHYSICAL EXAMINATION:  GENERAL:  Examination in the office, he was stable. VITAL SIGNS:  He was afebrile with pulse 72, blood pressure 132/73.  He walks without assistive device.  No wheezing with exertion. HEENT:  Head and neck exam is normal. CHEST:  Clear to auscultation. ABDOMEN:  Soft, nontender but obese. EXTREMITIES:  His right knee incision is well healed with full range of motion and stable ligaments.  His left knee has painful crepitation but only pain medially, some effusion noted.  No signs of infection, no venous lymphatic changes distally.  PREOPERATIVE LABS:  EKG pending  evaluation.  RADIOGRAPHS:  Radiographs of knee were available in our office for evaluation.  ASSESSMENT: 1. Advanced left knee osteoarthritis 2. Status post right total knee replacement.  PLAN:  Mr. Julian Mcdonald will be scheduled for same-day admitting surgery on April 02, 2011.  After reviewing with him the risks and benefits of tranexamic acid, he is a candidate to receive this.  He currently weighs 120 kg.  We will plan for 2- to 3-day hospital stay returning home with home health physical therapy as he has done before.     Madlyn Frankel Charlann Boxer, M.D.     MDO/MEDQ  D:  04/04/2011  T:  04/04/2011  Job:  [id number]  Electronically Signed by Durene Romans M.D. on 04/09/2011 09:13:31 AM

## 2011-04-09 NOTE — Op Note (Signed)
Julian Mcdonald, RAGSDALE            ACCOUNT NO.:  [id number]  MEDICAL RECORD NO.:  [id number]  LOCATION:  1605                         FACILITY:  [facility]  PHYSICIAN:  Madlyn Frankel. Charlann Boxer, M.D.  DATE OF BIRTH:  1941/07/15  DATE OF PROCEDURE:  04/02/2011 DATE OF DISCHARGE:                              OPERATIVE REPORT   PREOPERATIVE DIAGNOSIS:  Left knee osteoarthritis.  POSTOPERATIVE DIAGNOSIS:  Left knee osteoarthritis.  PROCEDURE:  Left total knee replacement.  COMPONENTS USED:  DePuy rotating platform posterior stabilized knee system with size 5 femur, 4 tibia, 10-mm insert and a 41 patellar button.  SURGEON:  Madlyn Frankel. Charlann Boxer, M.D.  ASSISTANT:  Surgical team.  ANESTHESIA:  Preoperative regional femoral nerve block, plus general anesthetic.  SPECIMENS:  None.  COMPLICATIONS:  None.  DRAINS:  One Hemovac.  TOURNIQUET TIME:  39 minutes at 250 mmHg.  INDICATIONS FOR PROCEDURE:  Mr. Brockbank is a 70 year old gentleman with history of bilateral knee osteoarthritis with history of right total knee replacement, he had been very pleased with.  His left knee at this point has failed conservative measures and he is ready to proceed with total knee replacement on this side.  Risks and benefits, and the postoperative course reviewed.  Consent was obtained for the benefit of pain relief.  PROCEDURE IN DETAIL:  The patient was brought to operative theater. Once adequate anesthesia, preoperative antibiotics, Ancef administered, he was positioned supine with a left thigh tourniquet placed.  The left lower extremity was then prepped and draped in sterile fashion with left foot placed in DeMayo leg holder.  Time-out was performed identifying the patient, planned procedure and extremity.  Leg was exsanguinated, tourniquet elevated to 250 mmHg.  Following this, a midline incision was made followed by median parapatellar arthrotomy.  Following median arthrotomy initial exposure,  attention was first directed to patella, precut measure was 26 mm.  I resected down to about 14-15 mm.  Lug holes were drilled for 41 patellar button to cover the cut surface and protect the surface from retractors.  A metal shim was placed.  Attention was now directed to the femur.  Femoral canal was opened a drill irrigated to try to prevent fat emboli.  An intramedullary rod was passed and 5 degrees of valgus, 10 mm bone resected off the distal femur.  Following this resection, the tibia was subluxated anteriorly. Using extramedullary guide, measured resection of 10 mm of proximal lateral tibia was made.  I confirmed the gap would be stable with a 10 mm insert medially and laterally.  We then also confirmed the cut was perpendicular in the coronal plane.  At this point, I sized the femur to be a size 5.  The size 5 rotation block was pinned anterior reference using a C clamp set rotation.  The former cutting block was then exchanged, the anterior-posterior chamfer cuts made without difficulty nor notching.  The tibia was then subluxated again and box cut was made off the lateral aspect of distal femur.  The tibia was then subluxated again anteriorly following removal of medial osteophytes, size 4 tibial tray seemed to fit best, was pinned in position through the medial third of tubercle, drilled and keel  punch trial reduction was now carried out with 5 femur, 4 tibia, 10-mm insert, knee came to full extension, was stable from extension to flexion with the patella tracking through the trochlea without application pressure.  Given these findings, the trial components removed.  The knee was irrigated with normal saline solution, pulse lavage.  The final components were opened cement mixed.  Final components were cemented onto clean and dried cut surfaces of bone.  The knee was brought to extension with a 10 mm insert and extruded cement was removed.  Once the cement had fully cured  and excessive cement was removed throughout the knee, the final 10-mm insert was chosen and placed.  The tourniquet had been let down at 39 minutes without significant hemostasis required.  The Hemovac drain was placed deep and extensor mechanism was then reapproximated using #1 Vicryl the knee in flexion.  The remaining of the wound was closed with 2-0 Vicryl and running 4-0 Monocryl.  The knee was cleaned, dried, and dressed sterilely at this point with Dermabond and Aquacel dressing.  The drain site was dressed separately.  The knee was wrapped in Ace wrap.  He was brought to recovery room, extubated in stable addition, tolerating the procedure well.     Madlyn Frankel Charlann Boxer, M.D.     MDO/MEDQ  D:  04/02/2011  T:  04/02/2011  Job:  [id number]  Electronically Signed by Durene Romans M.D. on 04/09/2011 09:13:14 AM

## 2011-04-09 NOTE — Discharge Summary (Signed)
  NAMEMAICOL, BOWLAND            ACCOUNT NO.:  [id number]  MEDICAL RECORD NO.:  [id number]  LOCATION:  1605                         FACILITY:  [facility]  PHYSICIAN:  Madlyn Frankel. Charlann Boxer, M.D.  DATE OF BIRTH:  [birth year]  DATE OF ADMISSION:  04/02/2011 DATE OF DISCHARGE:  04/04/2011                              DISCHARGE SUMMARY   ADMITTING DIAGNOSIS:  Left knee osteoarthritis.  DISCHARGE DIAGNOSES: 1. Left knee osteoarthritis. 2. Osteoarthritis. 3. Hypertension.  ADMITTING HISTORY:  Mr. Pound is a 70 year old patient of mine with previous arthroplasty procedures for arthritis including right total knee replacement.  He has done well but we have been unable to manage his left knee arthritis conservatively with injections and medications and at this point, he is ready to proceed with knee arthroplasty.  Risks and benefits were reviewed.  Consent was obtained in the office based on discussion at the time of history and physical. HOSPITAL COURSE:  The patient admitted for same-day surgery on April 02, 2011.  He underwent a left total knee replacement.  Please see dictated operative note for full details of procedure. Postoperatively, he was transferred to the recovery room to orthopedic ward where he remained for his 2-day hospital stay.  Postop day #1, his Hemovac drain and Foley catheter removed and he was seen and evaluated by Physical Therapy.  There was a change in his medicine from oxycodone to Norco to try to help with moderate pain but also allow him to remain as functional as possible.  He was passing gas and having flatus but had not yet had a bowel movement.  By postoperative day #2, his hematocrit was 34 compared to 34.3 on postop day #1.  His electrolytes remained stable.  His left knee wound was clean, dry, intact.  By postop day #2, despite having a little bit of nausea and decreased appetite, he had successfully transitioned to therapy though he is not doing great  with range of motion, he felt he is ready to go home.  DISCHARGE INSTRUCTIONS:  The patient will be discharged to home with instructions to follow up with Dr. Durene Romans at [facility] at [phone] in 2 weeks' time.  He will keep his wound dry until followup.  DISCHARGE MEDICATIONS:  His discharge medications will include his home medications of: 1. Hydrochlorothiazide 25 mg q.a.m. 2. Lisinopril 40 mg q.a.m. 3. Relafen as needed. 4. Diltiazem 120 mg 2 capsules in the morning and one in the evening. 5. In addition, he will be discharged on Colace 100 mg p.o. b.i.d. as     needed for constipation while on pain medicine. 6. Aspirin 325 mg b.i.d. for 30 days. 7. Norco 7/325 1 to 2 tablets every 4 to 6 hours as needed for pain. 8. Robaxin 500 mg p.o. q.6h. as needed for muscle spasm. 9. MiraLax 17 g p.o. daily as needed for constipation. 10.Simvastatin 40 mg q.h.s.  Questions were encouraged and reviewed at the time of discharge.     Madlyn Frankel Charlann Boxer, M.D.     MDO/MEDQ  D:  04/04/2011  T:  04/04/2011  Job:  [id number]  Electronically Signed by Durene Romans M.D. on 04/09/2011 09:13:22 AM

## 2011-06-25 ENCOUNTER — Other Ambulatory Visit: Payer: Self-pay | Admitting: Internal Medicine

## 2011-08-14 ENCOUNTER — Other Ambulatory Visit: Payer: Self-pay | Admitting: Internal Medicine

## 2012-01-02 ENCOUNTER — Ambulatory Visit (INDEPENDENT_AMBULATORY_CARE_PROVIDER_SITE_OTHER): Payer: Medicare Other | Admitting: Internal Medicine

## 2012-01-02 ENCOUNTER — Encounter: Payer: Self-pay | Admitting: Internal Medicine

## 2012-01-02 VITALS — BP 128/80 | Temp 98.6°F | Wt 269.0 lb

## 2012-01-02 DIAGNOSIS — L259 Unspecified contact dermatitis, unspecified cause: Secondary | ICD-10-CM

## 2012-01-02 DIAGNOSIS — I10 Essential (primary) hypertension: Secondary | ICD-10-CM

## 2012-01-02 DIAGNOSIS — L309 Dermatitis, unspecified: Secondary | ICD-10-CM

## 2012-01-02 MED ORDER — TRIAMCINOLONE ACETONIDE 0.1 % EX CREA: TOPICAL_CREAM | Freq: Two times a day (BID) | CUTANEOUS | Status: AC

## 2012-01-02 NOTE — Progress Notes (Signed)
  Subjective:    Patient ID: Julian Mcdonald, male    DOB: [birth year], 71 y.o.   MRN: [id number]  HPI  71 year old patient who has treated hypertension. He presents today with a chief complaint of dry itchy skin as well as a rash involving his left lower anterolateral leg. He has treated dyslipidemia.    Review of Systems  Constitutional: Negative for fever, chills, appetite change and fatigue.  HENT: Negative for hearing loss, ear pain, congestion, sore throat, trouble swallowing, neck stiffness, dental problem, voice change and tinnitus.   Eyes: Negative for pain, discharge and visual disturbance.  Respiratory: Negative for cough, chest tightness, wheezing and stridor.   Cardiovascular: Negative for chest pain, palpitations and leg swelling.  Gastrointestinal: Negative for nausea, vomiting, abdominal pain, diarrhea, constipation, blood in stool and abdominal distention.  Genitourinary: Negative for urgency, hematuria, flank pain, discharge, difficulty urinating and genital sores.  Musculoskeletal: Negative for myalgias, back pain, joint swelling, arthralgias and gait problem.  Skin: Positive for rash.  Neurological: Negative for dizziness, syncope, speech difficulty, weakness, numbness and headaches.  Hematological: Negative for adenopathy. Does not bruise/bleed easily.  Psychiatric/Behavioral: Negative for behavioral problems and dysphoric mood. The patient is not nervous/anxious.        Objective:   Physical Exam  Constitutional: He is oriented to person, place, and time. He appears well-developed.  HENT:  Head: Normocephalic.  Right Ear: External ear normal.  Left Ear: External ear normal.  Eyes: Conjunctivae and EOM are normal.  Neck: Normal range of motion.  Cardiovascular: Normal rate and normal heart sounds.   Pulmonary/Chest: Breath sounds normal.  Abdominal: Bowel sounds are normal.  Musculoskeletal: Normal range of motion. He exhibits no edema and no tenderness.    Neurological: He is alert and oriented to person, place, and time.  Skin: Rash noted.       Patchy area of erythema and scaling involving his left anterolateral lower leg  Psychiatric: He has a normal mood and affect. His behavior is normal.          Assessment & Plan:   Hypertension stable Nonspecific dermatitis left leg. Will treat with triamcinolone Dry skin. Moisturizing cream recommended

## 2012-01-02 NOTE — Patient Instructions (Signed)
Limit your sodium (Salt) intake    It is important that you exercise regularly, at least 20 minutes 3 to 4 times per week.  If you develop chest pain or shortness of breath seek  medical attention.  You need to lose weight.  Consider a lower calorie diet and regular exercise. 

## 2012-01-03 ENCOUNTER — Other Ambulatory Visit: Payer: Self-pay | Admitting: Internal Medicine

## 2012-01-22 ENCOUNTER — Other Ambulatory Visit: Payer: Self-pay

## 2012-01-22 MED ORDER — DILTIAZEM HCL 120 MG PO TABS: ORAL_TABLET | ORAL | Status: DC

## 2012-01-22 MED ORDER — SIMVASTATIN 40 MG PO TABS: 40.0000 mg | ORAL_TABLET | Freq: Every day | ORAL | Status: DC

## 2012-01-22 MED ORDER — LISINOPRIL 40 MG PO TABS: 40.0000 mg | ORAL_TABLET | Freq: Every day | ORAL | Status: DC

## 2012-01-22 MED ORDER — NABUMETONE 750 MG PO TABS: 750.0000 mg | ORAL_TABLET | Freq: Two times a day (BID) | ORAL | Status: DC

## 2012-01-22 NOTE — Telephone Encounter (Signed)
Faxed to [facility] mail order

## 2012-04-14 ENCOUNTER — Ambulatory Visit (INDEPENDENT_AMBULATORY_CARE_PROVIDER_SITE_OTHER): Payer: Medicare Other | Admitting: Internal Medicine

## 2012-04-14 ENCOUNTER — Encounter: Payer: Self-pay | Admitting: Internal Medicine

## 2012-04-14 VITALS — BP 130/80 | Temp 98.3°F | Wt 269.0 lb

## 2012-04-14 DIAGNOSIS — M25511 Pain in right shoulder: Secondary | ICD-10-CM

## 2012-04-14 DIAGNOSIS — M1990 Unspecified osteoarthritis, unspecified site: Secondary | ICD-10-CM

## 2012-04-14 DIAGNOSIS — I10 Essential (primary) hypertension: Secondary | ICD-10-CM

## 2012-04-14 DIAGNOSIS — M25519 Pain in unspecified shoulder: Secondary | ICD-10-CM

## 2012-04-14 NOTE — Patient Instructions (Signed)
Call in 2 days for orthopedic referral if you are not dramatically improved  Continue Relafen twice daily

## 2012-04-14 NOTE — Progress Notes (Signed)
  Subjective:    Patient ID: Julian Mcdonald, male    DOB: May 14, 1941, 71 y.o.   MRN: [id number]  HPI 71 year old patient who has a history of treated hypertension. He has osteoarthritis and has been on chronic Relafen. 2 days ago while walking the patient fell sustaining trauma to the  shoulder Region. He continues to have pain but there has been modest improvement. He uses Relafen but states he does not require additional analgesics.  Review of Systems  Musculoskeletal: Positive for arthralgias (right shoulder pain).       Objective:   Physical Exam  Constitutional: He appears well-developed and well-nourished. No distress.  Musculoskeletal:       No obvious soft tissue swelling or ecchymoses Passive range of motion was fairly well preserved The patient could not abduct the arm due to pain          Assessment & Plan:   Right shoulder pain following trauma. Options were discussed. Quite concerned about an impingement syndrome/rotator cuff tear etc.  have elected to observe for a couple days and continue Relafen. If he is not improved in 2 days and has not regained full range of motion has been asked to call for orthopedic referral. This likely will be required

## 2012-05-07 ENCOUNTER — Ambulatory Visit (INDEPENDENT_AMBULATORY_CARE_PROVIDER_SITE_OTHER): Payer: Medicare Other | Admitting: Internal Medicine

## 2012-05-07 ENCOUNTER — Encounter: Payer: Self-pay | Admitting: Internal Medicine

## 2012-05-07 VITALS — BP 130/90 | Temp 98.6°F | Wt 265.0 lb

## 2012-05-07 DIAGNOSIS — I10 Essential (primary) hypertension: Secondary | ICD-10-CM

## 2012-05-07 DIAGNOSIS — M25511 Pain in right shoulder: Secondary | ICD-10-CM

## 2012-05-07 DIAGNOSIS — M1990 Unspecified osteoarthritis, unspecified site: Secondary | ICD-10-CM

## 2012-05-07 DIAGNOSIS — M25519 Pain in unspecified shoulder: Secondary | ICD-10-CM

## 2012-05-07 DIAGNOSIS — J45909 Unspecified asthma, uncomplicated: Secondary | ICD-10-CM

## 2012-05-07 NOTE — Patient Instructions (Signed)
Call or return to clinic prn if these symptoms worsen or fail to improve as anticipated.

## 2012-05-07 NOTE — Progress Notes (Signed)
  Subjective:    Patient ID: Julian Mcdonald, male    DOB: Aug 29, 1941, 71 y.o.   MRN: [id number]  HPI  71 year old patient who is seen today for followup. Medical problems include stable hypertension and osteoarthritis. He was seen here approximately 3 weeks ago for right shoulder pain following trauma. Approximately 3 days after this visit he fell again sustaining trauma to the same right shoulder. Pain is improving. No decreased range of motion or bruising    Review of Systems  Musculoskeletal: Positive for arthralgias.       Objective:   Physical Exam  Constitutional: He appears well-developed and well-nourished. No distress.       Blood pressure well controlled  Musculoskeletal:       Intent range of motion right shoulder. No bruising.          Assessment & Plan:   Traumatic right shoulder pain. Pain seems to be improving. We'll continue to observe. If symptoms fail to respond completely will call the office for consideration of orthopedic referral. Hypertension stable

## 2012-06-04 ENCOUNTER — Ambulatory Visit (INDEPENDENT_AMBULATORY_CARE_PROVIDER_SITE_OTHER): Payer: Medicare Other | Admitting: Internal Medicine

## 2012-06-04 ENCOUNTER — Encounter: Payer: Self-pay | Admitting: Internal Medicine

## 2012-06-04 VITALS — BP 140/80 | Wt 270.0 lb

## 2012-06-04 DIAGNOSIS — E119 Type 2 diabetes mellitus without complications: Secondary | ICD-10-CM

## 2012-06-04 DIAGNOSIS — I10 Essential (primary) hypertension: Secondary | ICD-10-CM

## 2012-06-04 DIAGNOSIS — E785 Hyperlipidemia, unspecified: Secondary | ICD-10-CM

## 2012-06-04 DIAGNOSIS — L02519 Cutaneous abscess of unspecified hand: Secondary | ICD-10-CM

## 2012-06-04 DIAGNOSIS — S61209A Unspecified open wound of unspecified finger without damage to nail, initial encounter: Secondary | ICD-10-CM

## 2012-06-04 MED ORDER — CEPHALEXIN 500 MG PO CAPS: 500.0000 mg | ORAL_CAPSULE | Freq: Four times a day (QID) | ORAL | Status: AC

## 2012-06-04 MED ORDER — CEPHALEXIN 500 MG PO CAPS: 500.0000 mg | ORAL_CAPSULE | Freq: Four times a day (QID) | ORAL | Status: DC

## 2012-06-04 NOTE — Progress Notes (Signed)
  Subjective:    Patient ID: Julian Mcdonald, male    DOB: [birth year], 71 y.o.   MRN: [id number]  HPI  71 year old patient who has a history of treated hypertension and dyslipidemia.  Approximately 5 weeks ago he sustained trauma to his left medial distal third finger finger while using a butcher knife. He has had persistent pain and swelling    Review of Systems  Skin: Positive for wound.       Objective:   Physical Exam  Constitutional: He appears well-developed and well-nourished. He appears distressed.  Skin:       A laceration was noted involving his left third distal medial finger. There is some associated soft tissue swelling and redness a small flap of skin was noted that was slightly loose          Assessment & Plan:  Nonhealing laceration to the left third distal finger. We'll set up for a debridement of the finger Hypertension stable  Procedure note;  after local anesthesia with 1% Xylocaine without epinephrine the left third medial finger was debrided.  Local cautery was applied for control of bleeding. The wound dressed. The patient we treated with antibiotic therapy

## 2012-06-04 NOTE — Patient Instructions (Signed)
Take your antibiotic as prescribed until ALL of it is gone, but stop if you develop a rash, swelling, or any side effects of the medication.  Contact our office as soon as possible if  there are side effects of the medication.  Soak the left hand in 3-4 times daily  Call or return to clinic prn if these symptoms worsen or fail to improve as anticipated.

## 2012-07-03 ENCOUNTER — Encounter: Payer: Self-pay | Admitting: Internal Medicine

## 2012-07-03 ENCOUNTER — Ambulatory Visit (INDEPENDENT_AMBULATORY_CARE_PROVIDER_SITE_OTHER): Payer: Medicare Other | Admitting: Internal Medicine

## 2012-07-03 VITALS — BP 142/90 | HR 72 | Temp 98.0°F | Resp 20 | Ht 69.0 in | Wt 255.0 lb

## 2012-07-03 DIAGNOSIS — E785 Hyperlipidemia, unspecified: Secondary | ICD-10-CM

## 2012-07-03 DIAGNOSIS — Z0000 Encounter for general adult medical examination without abnormal findings: Secondary | ICD-10-CM

## 2012-07-03 DIAGNOSIS — M1990 Unspecified osteoarthritis, unspecified site: Secondary | ICD-10-CM

## 2012-07-03 DIAGNOSIS — Z86010 Personal history of colonic polyps: Secondary | ICD-10-CM

## 2012-07-03 DIAGNOSIS — Z23 Encounter for immunization: Secondary | ICD-10-CM

## 2012-07-03 DIAGNOSIS — I10 Essential (primary) hypertension: Secondary | ICD-10-CM

## 2012-07-03 LAB — CBC WITH DIFFERENTIAL/PLATELET
Basophils Relative: 0.8 % (ref 0.0–3.0)
Eosinophils Absolute: 0.4 10*3/uL (ref 0.0–0.7)
MCHC: 33.6 g/dL (ref 30.0–36.0)
MCV: 93.8 fl (ref 78.0–100.0)
Monocytes Absolute: 0.7 10*3/uL (ref 0.1–1.0)
Neutrophils Relative %: 76 % (ref 43.0–77.0)
Platelets: 233 10*3/uL (ref 150.0–400.0)
RBC: 4.21 Mil/uL — ABNORMAL LOW (ref 4.22–5.81)

## 2012-07-03 LAB — COMPREHENSIVE METABOLIC PANEL
AST: 17 U/L (ref 0–37)
Albumin: 4.1 g/dL (ref 3.5–5.2)
Alkaline Phosphatase: 66 U/L (ref 39–117)
Glucose, Bld: 92 mg/dL (ref 70–99)
Potassium: 4.5 mEq/L (ref 3.5–5.1)
Sodium: 140 mEq/L (ref 135–145)
Total Protein: 7.1 g/dL (ref 6.0–8.3)

## 2012-07-03 LAB — TSH: TSH: 1.36 u[IU]/mL (ref 0.35–5.50)

## 2012-07-03 LAB — LIPID PANEL: VLDL: 15 mg/dL (ref 0.0–40.0)

## 2012-07-03 MED ORDER — SIMVASTATIN 40 MG PO TABS: 40.0000 mg | ORAL_TABLET | Freq: Every day | ORAL | Status: DC

## 2012-07-03 MED ORDER — DILTIAZEM HCL 120 MG PO TABS: ORAL_TABLET | ORAL | Status: DC

## 2012-07-03 MED ORDER — NABUMETONE 750 MG PO TABS: 750.0000 mg | ORAL_TABLET | Freq: Two times a day (BID) | ORAL | Status: DC

## 2012-07-03 MED ORDER — LISINOPRIL 40 MG PO TABS: 40.0000 mg | ORAL_TABLET | Freq: Every day | ORAL | Status: DC

## 2012-07-03 NOTE — Progress Notes (Signed)
Subjective:    Patient ID: Julian Mcdonald, male    DOB: 1941/06/05, 70 y.o.   MRN: [id number]  HPI  71 year old patient who is seen today for a wellness exam. Medical problems include treated hypertension and dyslipidemia. He has osteoarthritis and is status post bilateral knee replacement surgeries. He has exogenous obesity but presently is dieting and has had a 15 pound weight loss. No new concerns or complaints. He also has a history of colonic polyps;  his last colonoscopy was approximately 2 years ago by Dr. Loreta Ave  Allergies:  1) ! Morphine   Past History:  Past Medical History:  Reviewed history from 06/19/2010 and no changes required.   Asthma  Headache  Hyperlipidemia  Hypertension  Low back pain  morbid obesity  Colonic polyps, hx of  Osteoarthritis  1. Risk factors, based on past  M,S,F history-cardiovascular risk factors include hypertension and dyslipidemia  2.  Physical activities: Remains fairly active in spite of his arthritis. He is active with golf  3.  Depression/mood: No history depression or mood disorder  4.  Hearing: No deficits  5.  ADL's: Independent in all aspects of daily living  6.  Fall risk: Low  7.  Home safety: No problems identified  8.  Height weight, and visual acuity; height and weight stable no change in visual acuity  9.  Counseling: Ongoing exercise and weight loss encouraged  10. Lab orders based on risk factors: Laboratory profile including lipid panel will be reviewed  11. Referral : Followup colonoscopy in 3 years  12. Care plan: Continue active diet weight loss and exercise regimen  13. Cognitive assessment: Alert and oriented with normal affect. No cognitive dysfunction.  Past Medical History  Diagnosis Date  . ASTHMA 06/19/2010  . COLONIC POLYPS, HX OF 06/19/2010  . HYPERLIPIDEMIA 06/19/2010  . HYPERTENSION 06/19/2010  . LOW BACK PAIN 06/19/2010  . OSTEOARTHRITIS 06/19/2010  . Morbid obesity     History   Social  History  . Marital Status: Married    Spouse Name: N/A    Number of Children: N/A  . Years of Education: N/A   Occupational History  . Not on file.   Social History Main Topics  . Smoking status: Former Smoker    Quit date: 10/01/1996  . Smokeless tobacco: Never Used  . Alcohol Use: Not on file  . Drug Use: Not on file  . Sexually Active: Not on file   Other Topics Concern  . Not on file   Social History Narrative  . No narrative on file    Past Surgical History  Procedure Date  . Total knee arthroplasty   . Lumbar laminectomy   . Tonsillectomy     No family history on file.  Allergies  Allergen Reactions  . Morphine     Current Outpatient Prescriptions on File Prior to Visit  Medication Sig Dispense Refill  . diltiazem (CARDIZEM) 120 MG tablet 2 qAM qnd 1 qhs  270 tablet  3  . hydrochlorothiazide 25 MG tablet Take 25 mg by mouth daily.        Marland Kitchen lisinopril (PRINIVIL,ZESTRIL) 40 MG tablet Take 1 tablet (40 mg total) by mouth daily.  90 tablet  3  . Multiple Vitamin (MULTIVITAMIN) tablet Take 1 tablet by mouth daily.        . nabumetone (RELAFEN) 750 MG tablet Take 1 tablet (750 mg total) by mouth 2 (two) times daily.  180 tablet  3  . simvastatin (ZOCOR) 40  MG tablet Take 1 tablet (40 mg total) by mouth daily.  90 tablet  3  . triamcinolone cream (KENALOG) 0.1 % Apply topically 2 (two) times daily.  30 g  0    BP 142/90  Pulse 72  Temp 98 F (36.7 C) (Oral)  Resp 20  Ht 5\' 9"  (1.753 m)  Wt 255 lb (115.667 kg)  BMI 37.66 kg/m2  SpO2 98%         Review of Systems  Constitutional: Negative for fever, chills, activity change, appetite change and fatigue.  HENT: Negative for hearing loss, ear pain, congestion, rhinorrhea, sneezing, mouth sores, trouble swallowing, neck pain, neck stiffness, dental problem, voice change, sinus pressure and tinnitus.   Eyes: Negative for photophobia, pain, redness and visual disturbance.  Respiratory: Negative for apnea,  cough, choking, chest tightness, shortness of breath and wheezing.   Cardiovascular: Negative for chest pain, palpitations and leg swelling.  Gastrointestinal: Positive for constipation. Negative for nausea, vomiting, abdominal pain, diarrhea, blood in stool, abdominal distention, anal bleeding and rectal pain.  Genitourinary: Negative for dysuria, urgency, frequency, hematuria, flank pain, decreased urine volume, discharge, penile swelling, scrotal swelling, difficulty urinating, genital sores and testicular pain.  Musculoskeletal: Negative for myalgias, back pain, joint swelling, arthralgias and gait problem.  Skin: Negative for color change, rash and wound.  Neurological: Negative for dizziness, tremors, seizures, syncope, facial asymmetry, speech difficulty, weakness, light-headedness, numbness and headaches.  Hematological: Negative for adenopathy. Does not bruise/bleed easily.  Psychiatric/Behavioral: Negative for suicidal ideas, hallucinations, behavioral problems, confusion, disturbed wake/sleep cycle, self-injury, dysphoric mood, decreased concentration and agitation. The patient is not nervous/anxious.        Objective:   Physical Exam  Constitutional: He appears well-developed and well-nourished.  HENT:  Head: Normocephalic and atraumatic.  Right Ear: External ear normal.  Left Ear: External ear normal.  Nose: Nose normal.  Mouth/Throat: Oropharynx is clear and moist.       Xanthelasma  Eyes: Conjunctivae normal and EOM are normal. Pupils are equal, round, and reactive to light. No scleral icterus.  Neck: Normal range of motion. Neck supple. No JVD present. No thyromegaly present.  Cardiovascular: Regular rhythm, normal heart sounds and intact distal pulses.  Exam reveals no gallop and no friction rub.   No murmur heard. Pulmonary/Chest: Effort normal and breath sounds normal. He exhibits no tenderness.  Abdominal: Soft. Bowel sounds are normal. He exhibits no distension and no  mass. There is no tenderness.  Genitourinary: Prostate normal and penis normal. Guaiac negative stool.  Musculoskeletal: Normal range of motion. He exhibits no edema and no tenderness.       Status post bilateral total knee replacement surgery  Lymphadenopathy:    He has no cervical adenopathy.  Neurological: He is alert. He has normal reflexes. No cranial nerve deficit. Coordination normal.  Skin: Skin is warm and dry. No rash noted.  Psychiatric: He has a normal mood and affect. His behavior is normal.          Assessment & Plan:   Preventive health examination Hypertension well controlled. Repeat blood pressure 130/70 Dyslipidemia. We'll review a lipid profile Osteoarthritis Obesity. Ongoing efforts at weight loss encouraged  Recheck 1 year

## 2012-07-03 NOTE — Patient Instructions (Addendum)
Limit your sodium (Salt) intake  You need to lose weight.  Consider a lower calorie diet and regular exercise.  Please check your blood pressure on a regular basis.  If it is consistently greater than 150/90, please make an office appointment.  It  is important that you exercise regularly, at least 20 minutes 3 to 4 times per week.  If you develop chest pain or shortness of breath seek  medical attention.  Return in one year for follow-up    

## 2012-07-08 ENCOUNTER — Telehealth: Payer: Self-pay | Admitting: Internal Medicine

## 2012-07-08 NOTE — Telephone Encounter (Signed)
Spoke with pt- informed of results

## 2012-07-08 NOTE — Telephone Encounter (Signed)
Please advise 

## 2012-07-08 NOTE — Telephone Encounter (Signed)
Patient's spouse called stating that he would like a call back with lab results. Please assist.

## 2012-07-08 NOTE — Telephone Encounter (Signed)
Please call/notify patient that lab/test/procedure is normal  cHOL 103

## 2013-02-04 ENCOUNTER — Other Ambulatory Visit: Payer: Self-pay | Admitting: Internal Medicine

## 2013-02-16 ENCOUNTER — Other Ambulatory Visit: Payer: Self-pay | Admitting: *Deleted

## 2013-02-16 MED ORDER — DILTIAZEM HCL 120 MG PO TABS: ORAL_TABLET | ORAL | Status: DC

## 2013-02-16 MED ORDER — SIMVASTATIN 40 MG PO TABS: 40.0000 mg | ORAL_TABLET | Freq: Every day | ORAL | Status: DC

## 2013-07-08 ENCOUNTER — Ambulatory Visit (INDEPENDENT_AMBULATORY_CARE_PROVIDER_SITE_OTHER): Payer: Medicare Other

## 2013-07-08 DIAGNOSIS — Z23 Encounter for immunization: Secondary | ICD-10-CM

## 2013-09-07 ENCOUNTER — Other Ambulatory Visit: Payer: Self-pay | Admitting: Internal Medicine

## 2013-12-11 ENCOUNTER — Other Ambulatory Visit: Payer: Self-pay | Admitting: Internal Medicine

## 2013-12-22 ENCOUNTER — Ambulatory Visit (INDEPENDENT_AMBULATORY_CARE_PROVIDER_SITE_OTHER): Payer: Medicare Other | Admitting: Internal Medicine

## 2013-12-22 ENCOUNTER — Encounter: Payer: Self-pay | Admitting: Internal Medicine

## 2013-12-22 VITALS — BP 140/82 | HR 74 | Temp 99.0°F | Resp 20 | Ht 69.0 in | Wt 275.0 lb

## 2013-12-22 DIAGNOSIS — E785 Hyperlipidemia, unspecified: Secondary | ICD-10-CM

## 2013-12-22 DIAGNOSIS — I10 Essential (primary) hypertension: Secondary | ICD-10-CM

## 2013-12-22 DIAGNOSIS — R079 Chest pain, unspecified: Secondary | ICD-10-CM

## 2013-12-22 DIAGNOSIS — G4733 Obstructive sleep apnea (adult) (pediatric): Secondary | ICD-10-CM

## 2013-12-22 DIAGNOSIS — M1990 Unspecified osteoarthritis, unspecified site: Secondary | ICD-10-CM

## 2013-12-22 MED ORDER — DILTIAZEM HCL 120 MG PO TABS: ORAL_TABLET | ORAL | Status: DC

## 2013-12-22 MED ORDER — LISINOPRIL 40 MG PO TABS: ORAL_TABLET | ORAL | Status: DC

## 2013-12-22 MED ORDER — NABUMETONE 750 MG PO TABS: ORAL_TABLET | ORAL | Status: DC

## 2013-12-22 MED ORDER — SIMVASTATIN 40 MG PO TABS: ORAL_TABLET | ORAL | Status: DC

## 2013-12-22 NOTE — Patient Instructions (Signed)
Exercise stress test as discussed  Sleep study as discussed  Return in 6 months for follow-up

## 2013-12-22 NOTE — Progress Notes (Signed)
Subjective:    Patient ID: Julian Mcdonald, male    DOB: 06-02-1941, 73 y.o.   MRN: [id number]  HPI  74 year old patient who has not been seen in over one year.  He has a history of hypertension and dyslipidemia.  He also has a history of arthritis and is status post bilateral total knee repair in the past.  He is anticipating hip replacement surgery He complains of the fatigue and lack of energy.  His wife does describe him as being a loud snorer.  He does have a history of exertional chest pain described as a burning sensation that occurs sporadically. He has a history of colonic polyps and probably is due for followup colonoscopy later this year.  He has seen Dr. Collene Mares in the past  Past Medical History  Diagnosis Date  . ASTHMA 06/19/2010  . COLONIC POLYPS, HX OF 06/19/2010  . HYPERLIPIDEMIA 06/19/2010  . HYPERTENSION 06/19/2010  . LOW BACK PAIN 06/19/2010  . OSTEOARTHRITIS 06/19/2010  . Morbid obesity     History   Social History  . Marital Status: Married    Spouse Name: N/A    Number of Children: N/A  . Years of Education: N/A   Occupational History  . Not on file.   Social History Main Topics  . Smoking status: Former Smoker    Quit date: 10/01/1996  . Smokeless tobacco: Never Used  . Alcohol Use: Not on file  . Drug Use: Not on file  . Sexual Activity: Not on file   Other Topics Concern  . Not on file   Social History Narrative  . No narrative on file    Past Surgical History  Procedure Laterality Date  . Total knee arthroplasty    . Lumbar laminectomy    . Tonsillectomy      No family history on file.  Allergies  Allergen Reactions  . Morphine     Current Outpatient Prescriptions on File Prior to Visit  Medication Sig Dispense Refill  . Multiple Vitamin (MULTIVITAMIN) tablet Take 1 tablet by mouth daily.         No current facility-administered medications on file prior to visit.    BP 140/82  Pulse 74  Temp(Src) 99 F (37.2 C) (Oral)  Resp  20  Ht 5\' 9"  (1.753 m)  Wt 275 lb (124.739 kg)  BMI 40.59 kg/m2  SpO2 97%     Review of Systems  Constitutional: Positive for fatigue and unexpected weight change. Negative for fever, chills and appetite change.  HENT: Negative for congestion, dental problem, ear pain, hearing loss, sore throat, tinnitus, trouble swallowing and voice change.   Eyes: Negative for pain, discharge and visual disturbance.  Respiratory: Positive for chest tightness. Negative for cough, wheezing and stridor.   Cardiovascular: Negative for chest pain, palpitations and leg swelling.  Gastrointestinal: Negative for nausea, vomiting, abdominal pain, diarrhea, constipation, blood in stool and abdominal distention.  Genitourinary: Negative for urgency, hematuria, flank pain, discharge, difficulty urinating and genital sores.  Musculoskeletal: Negative for arthralgias, back pain, gait problem, joint swelling, myalgias and neck stiffness.  Skin: Negative for rash.  Neurological: Negative for dizziness, syncope, speech difficulty, weakness, numbness and headaches.  Hematological: Negative for adenopathy. Does not bruise/bleed easily.  Psychiatric/Behavioral: Negative for behavioral problems and dysphoric mood. The patient is not nervous/anxious.        Objective:   Physical Exam  Constitutional: He is oriented to person, place, and time. He appears well-developed.  Blood pressure 140/70  Weight 275  HENT:  Head: Normocephalic.  Right Ear: External ear normal.  Left Ear: External ear normal.  Pharyngeal crowding with a low hanging soft palate  Eyes: Conjunctivae and EOM are normal.  Neck: Normal range of motion.  Cardiovascular: Normal rate and normal heart sounds.   Pulmonary/Chest: Breath sounds normal.  Abdominal: Bowel sounds are normal.  Musculoskeletal: Normal range of motion. He exhibits no edema and no tenderness.  Neurological: He is alert and oriented to person, place, and time.  Psychiatric: He  has a normal mood and affect. His behavior is normal.          Assessment & Plan:    episodic exertional chest pain.  Patient has a conduction defect.  Will schedule nuclear stress test  Hypertension stable  Obesity  Dyslipidemia  Rule out OSA.  Home sleep study will be set up   CPX in 6 months

## 2013-12-22 NOTE — Progress Notes (Signed)
Pre-visit discussion using our clinic review tool. No additional management support is needed unless otherwise documented below in the visit note.  

## 2013-12-23 ENCOUNTER — Telehealth: Payer: Self-pay | Admitting: Internal Medicine

## 2013-12-23 NOTE — Telephone Encounter (Signed)
Relevant patient education mailed to patient.  

## 2014-01-12 ENCOUNTER — Ambulatory Visit (HOSPITAL_COMMUNITY): Payer: Medicare Other | Attending: Cardiovascular Disease | Admitting: Radiology

## 2014-01-12 VITALS — BP 175/81 | HR 68 | Ht 69.0 in | Wt 267.0 lb

## 2014-01-12 DIAGNOSIS — R0789 Other chest pain: Secondary | ICD-10-CM | POA: Insufficient documentation

## 2014-01-12 DIAGNOSIS — R079 Chest pain, unspecified: Secondary | ICD-10-CM

## 2014-01-12 DIAGNOSIS — R5383 Other fatigue: Secondary | ICD-10-CM

## 2014-01-12 DIAGNOSIS — R5381 Other malaise: Secondary | ICD-10-CM | POA: Insufficient documentation

## 2014-01-12 MED ORDER — TECHNETIUM TC 99M SESTAMIBI GENERIC - CARDIOLITE
10.0000 | Freq: Once | INTRAVENOUS | Status: AC | PRN
  Administered 2014-01-12: 10 via INTRAVENOUS

## 2014-01-12 MED ORDER — REGADENOSON 0.4 MG/5ML IV SOLN
0.4000 mg | Freq: Once | INTRAVENOUS | Status: AC
  Administered 2014-01-12: 0.4 mg via INTRAVENOUS

## 2014-01-12 MED ORDER — TECHNETIUM TC 99M SESTAMIBI GENERIC - CARDIOLITE
30.0000 | Freq: Once | INTRAVENOUS | Status: AC | PRN
  Administered 2014-01-12: 30 via INTRAVENOUS

## 2014-01-12 NOTE — Progress Notes (Signed)
[facility] [address] [place], [place] [address] [phone]    Cardiology Nuclear Med Study  GREGORY BARRICK is a 73 y.o. male     MRN : [id number]     DOB: May 09, 1941  Procedure Date: 01/12/2014  Nuclear Med Background Indication for Stress Test:  Evaluation for Ischemia, and Pending Surgical Clearance for (L) THR  by Paralee Cancel, MD History:  No known CAD, Asthma Cardiac Risk Factors: History of Smoking, Hypertension and Lipids  Symptoms:  Chest Pain with Exertion (last date of chest discomfort was one week ago) and Fatigue   Nuclear Pre-Procedure Caffeine/Decaff Intake:  None > 12 hrs NPO After: 6:00pm   Lungs:  clear O2 Sat: 95% on room air. IV 0.9% NS with Angio Cath:  22g  IV Site: R Antecubital x 1, tolerated well IV Started by:  Irven Baltimore, RN  Chest Size (in):  50 Cup Size: n/a  Height: 5\' 9"  (1.753 m)  Weight:  267 lb (121.11 kg)  BMI:  Body mass index is 39.41 kg/(m^2). Tech Comments:  Took Diltiazem, and lisinopril this am.    Nuclear Med Study 1 or 2 day study: 1 day  Stress Test Type:  Treadmill/Lexiscan  Reading MD: N/A  Order Authorizing Provider:  Bluford Kaufmann, MD  Resting Radionuclide: Technetium 68m Sestamibi  Resting Radionuclide Dose: 11.0 mCi   Stress Radionuclide:  Technetium 3m Sestamibi  Stress Radionuclide Dose: 33.0 mCi           Stress Protocol Rest HR: 68 Stress HR: 109  Rest BP: 175/81 Stress BP: 210/100  Exercise Time (min): n/a METS: n/a           Dose of Adenosine (mg):  n/a Dose of Lexiscan: 0.4 mg  Dose of Atropine (mg): n/a Dose of Dobutamine: n/a mcg/kg/min (at max HR)  Stress Test Technologist: Glade Lloyd, BS-ES  Nuclear Technologist:  Annye Rusk, CNMT     Rest Procedure:  Myocardial perfusion imaging was performed at rest 45 minutes following the intravenous administration of Technetium 36m Sestamibi. Rest ECG: [id number]  Stress Procedure:  The patient received IV Lexiscan  0.4 mg over 15-seconds with concurrent low level exercise and then Technetium 48m Sestamibi was injected at 30-seconds while the patient continued walking one more minute.  Quantitative spect images were obtained after a 45-minute delay.  During the infusion of Lexiscan, the patient had some chest pressure and lightheadedness.  This resolved in recovery.  Stress ECG: No significant change from baseline ECG  QPS Raw Data Images:  Mild diaphragmatic attenuation; normal left ventricular size. Stress Images:  The uptake in the myovcardium is somewhat patchy.  There is a small , mild area of attenuation in the apex and the inferior base.       Rest Images:  The uptake in the myovcardium is somewhat patchy.  There is a small , mild area of attenuation in the apex and the inferior base.   Subtraction (SDS):  No evidence of ischemia.  The SDS = 1.  Transient Ischemic Dilatation (Normal <1.22):  1.00 Lung/Heart Ratio (Normal <0.45):  0.25  Quantitative Gated Spect Images QGS EDV:  144 ml QGS ESV:  67 ml  Impression Exercise Capacity:  Lexiscan with low level exercise. BP Response:  Normal blood pressure response. Clinical Symptoms:  No significant symptoms noted. ECG Impression:  No significant ST segment change suggestive of ischemia. Comparison with Prior Nuclear Study: No images to compare  Overall Impression:  Low risk stress nuclear study .  There are some areas of patchy uptake that appears to be artifact.  There is no ischemia.  The LV function is normal and there are no wall motion abnormalities.  .  The SDS = 1   LV Ejection Fraction: 54%.  LV Wall Motion:  Normal Wall Motion.   Thayer Headings, Brooke Bonito., MD, [facility] 01/12/2014, 4:58 PM Office - [phone] Pager [phone]

## 2014-03-11 ENCOUNTER — Encounter (HOSPITAL_COMMUNITY): Payer: Self-pay | Admitting: Pharmacy Technician

## 2014-03-11 NOTE — Progress Notes (Signed)
Surgery clearance note 12/27/13 Dr. Ricardo Jericho on chart

## 2014-03-11 NOTE — Patient Instructions (Addendum)
18 Julian Mcdonald  03/11/2014   Your procedure is scheduled on: Tuesday 03/23/14  Report to [facility] at 08:30 AM.  Call this number if you have problems the morning of surgery [phone]: [phone]   Remember:   Do not eat food or drink liquids After Midnight.     Take these medicines the morning of surgery with A SIP OF WATER: diltiazem   Do not wear jewelry, make-up or nail polish.  Do not wear lotions, powders, or perfumes. You may wear deodorant.  Do not shave 48 hours prior to surgery. Men may shave face and neck.  Do not bring valuables to the hospital.  Contacts, dentures or bridgework may not be worn into surgery.  Leave suitcase in the car. After surgery it may be brought to your room.  For patients admitted to the hospital, checkout time is 11:00 AM the day of discharge.   Please read over the following fact sheets that you were given: MRSA Information Paulette Blanch, RN  pre op nurse call if needed [phone]    [facility] - Preparing for Surgery Before surgery, you can play an important role.  Because skin is not sterile, your skin needs to be as free of germs as possible.  You can reduce the number of germs on your skin by washing with CHG (chlorahexidine gluconate) soap before surgery.  CHG is an antiseptic cleaner which kills germs and bonds with the skin to continue killing germs even after washing. Please DO NOT use if you have an allergy to CHG or antibacterial soaps.  If your skin becomes reddened/irritated stop using the CHG and inform your nurse when you arrive at Short Stay. Do not shave (including legs and underarms) for at least 48 hours prior to the first CHG shower.  You may shave your face/neck. Please follow these instructions carefully:  1.  Shower with CHG Soap the night before surgery and the  morning of Surgery.  2.  If you choose to wash your hair, wash your hair first as usual with your  normal  shampoo.  3.  After you shampoo, rinse  your hair and body thoroughly to remove the  shampoo.                            4.  Use CHG as you would any other liquid soap.  You can apply chg directly  to the skin and wash                       Gently with a scrungie or clean washcloth.  5.  Apply the CHG Soap to your body ONLY FROM THE NECK DOWN.   Do not use on face/ open                           Wound or open sores. Avoid contact with eyes, ears mouth and genitals (private parts).                       Wash face,  Genitals (private parts) with your normal soap.             6.  Wash thoroughly, paying special attention to the area where your surgery  will be performed.  7.  Thoroughly rinse your body with warm water from the neck down.  8.  DO  NOT shower/wash with your normal soap after using and rinsing off  the CHG Soap.                9.  Pat yourself dry with a clean towel.            10.  Wear clean pajamas.            11.  Place clean sheets on your bed the night of your first shower and do not  sleep with pets. Day of Surgery : Do not apply any lotions/deodorants the morning of surgery.  Please wear clean clothes to the hospital/surgery center.  FAILURE TO FOLLOW THESE INSTRUCTIONS MAY RESULT IN THE CANCELLATION OF YOUR SURGERY PATIENT SIGNATURE_________________________________  NURSE SIGNATURE__________________________________  ________________________________________________________________________  WHAT IS A BLOOD TRANSFUSION? Blood Transfusion Information  A transfusion is the replacement of blood or some of its parts. Blood is made up of multiple cells which provide different functions.  Red blood cells carry oxygen and are used for blood loss replacement.  White blood cells fight against infection.  Platelets control bleeding.  Plasma helps clot blood.  Other blood products are available for specialized needs, such as hemophilia or other clotting disorders. BEFORE THE TRANSFUSION  Who gives blood for  transfusions?   Healthy volunteers who are fully evaluated to make sure their blood is safe. This is blood bank blood. Transfusion therapy is the safest it has ever been in the practice of medicine. Before blood is taken from a donor, a complete history is taken to make sure that person has no history of diseases nor engages in risky social behavior (examples are intravenous drug use or sexual activity with multiple partners). The donor's travel history is screened to minimize risk of transmitting infections, such as malaria. The donated blood is tested for signs of infectious diseases, such as HIV and hepatitis. The blood is then tested to be sure it is compatible with you in order to minimize the chance of a transfusion reaction. If you or a relative donates blood, this is often done in anticipation of surgery and is not appropriate for emergency situations. It takes many days to process the donated blood. RISKS AND COMPLICATIONS Although transfusion therapy is very safe and saves many lives, the main dangers of transfusion include:   Getting an infectious disease.  Developing a transfusion reaction. This is an allergic reaction to something in the blood you were given. Every precaution is taken to prevent this. The decision to have a blood transfusion has been considered carefully by your caregiver before blood is given. Blood is not given unless the benefits outweigh the risks. AFTER THE TRANSFUSION  Right after receiving a blood transfusion, you will usually feel much better and more energetic. This is especially true if your red blood cells have gotten low (anemic). The transfusion raises the level of the red blood cells which carry oxygen, and this usually causes an energy increase.  The nurse administering the transfusion will monitor you carefully for complications. HOME CARE INSTRUCTIONS  No special instructions are needed after a transfusion. You may find your energy is better. Speak with  your caregiver about any limitations on activity for underlying diseases you may have. SEEK MEDICAL CARE IF:   Your condition is not improving after your transfusion.  You develop redness or irritation at the intravenous (IV) site. SEEK IMMEDIATE MEDICAL CARE IF:  Any of the following symptoms occur over the next 12 hours:  Shaking chills.  You have  a temperature by mouth above 102 F (38.9 C), not controlled by medicine.  Chest, back, or muscle pain.  People around you feel you are not acting correctly or are confused.  Shortness of breath or difficulty breathing.  Dizziness and fainting.  You get a rash or develop hives.  You have a decrease in urine output.  Your urine turns a dark color or changes to pink, red, or brown. Any of the following symptoms occur over the next 10 days:  You have a temperature by mouth above 102 F (38.9 C), not controlled by medicine.  Shortness of breath.  Weakness after normal activity.  The white part of the eye turns yellow (jaundice).  You have a decrease in the amount of urine or are urinating less often.  Your urine turns a dark color or changes to pink, red, or brown. Document Released: 09/14/2000 Document Revised: 12/10/2011 Document Reviewed: 05/03/2008 ExitCare Patient Information 2014 [place].  _______________________________________________________________________  Incentive Spirometer  An incentive spirometer is a tool that can help keep your lungs clear and active. This tool measures how well you are filling your lungs with each breath. Taking long deep breaths may help reverse or decrease the chance of developing breathing (pulmonary) problems (especially infection) following:  A long period of time when you are unable to move or be active. BEFORE THE PROCEDURE   If the spirometer includes an indicator to show your best effort, your nurse or respiratory therapist will set it to a desired goal.  If possible,  sit up straight or lean slightly forward. Try not to slouch.  Hold the incentive spirometer in an upright position. INSTRUCTIONS FOR USE  1. Sit on the edge of your bed if possible, or sit up as far as you can in bed or on a chair. 2. Hold the incentive spirometer in an upright position. 3. Breathe out normally. 4. Place the mouthpiece in your mouth and seal your lips tightly around it. 5. Breathe in slowly and as deeply as possible, raising the piston or the ball toward the top of the column. 6. Hold your breath for 3-5 seconds or for as long as possible. Allow the piston or ball to fall to the bottom of the column. 7. Remove the mouthpiece from your mouth and breathe out normally. 8. Rest for a few seconds and repeat Steps 1 through 7 at least 10 times every 1-2 hours when you are awake. Take your time and take a few normal breaths between deep breaths. 9. The spirometer may include an indicator to show your best effort. Use the indicator as a goal to work toward during each repetition. 10. After each set of 10 deep breaths, practice coughing to be sure your lungs are clear. If you have an incision (the cut made at the time of surgery), support your incision when coughing by placing a pillow or rolled up towels firmly against it. Once you are able to get out of bed, walk around indoors and cough well. You may stop using the incentive spirometer when instructed by your caregiver.  RISKS AND COMPLICATIONS  Take your time so you do not get dizzy or light-headed.  If you are in pain, you may need to take or ask for pain medication before doing incentive spirometry. It is harder to take a deep breath if you are having pain. AFTER USE  Rest and breathe slowly and easily.  It can be helpful to keep track of a log of your progress.  Your caregiver can provide you with a simple table to help with this. If you are using the spirometer at home, follow these instructions: [place] IF:   You  are having difficultly using the spirometer.  You have trouble using the spirometer as often as instructed.  Your pain medication is not giving enough relief while using the spirometer.  You develop fever of 100.5 F (38.1 C) or higher. SEEK IMMEDIATE MEDICAL CARE IF:   You cough up bloody sputum that had not been present before.  You develop fever of 102 F (38.9 C) or greater.  You develop worsening pain at or near the incision site. MAKE SURE YOU:   Understand these instructions.  Will watch your condition.  Will get help right away if you are not doing well or get worse. Document Released: 01/28/2007 Document Revised: 12/10/2011 Document Reviewed: 03/31/2007 [facility] Patient Information 2014 [place], [place].   ________________________________________________________________________

## 2014-03-12 ENCOUNTER — Encounter (HOSPITAL_COMMUNITY): Payer: Self-pay

## 2014-03-12 ENCOUNTER — Encounter (HOSPITAL_COMMUNITY)
Admission: RE | Admit: 2014-03-12 | Discharge: 2014-03-12 | Disposition: A | Discharge status: Home / Self Care | Payer: Medicare Other | Source: Ambulatory Visit | Attending: Orthopedic Surgery | Admitting: Orthopedic Surgery

## 2014-03-12 ENCOUNTER — Ambulatory Visit (HOSPITAL_COMMUNITY)
Admission: RE | Admit: 2014-03-12 | Discharge: 2014-03-12 | Disposition: A | Discharge status: Home / Self Care | Payer: Medicare Other | Source: Ambulatory Visit | Attending: Anesthesiology | Admitting: Anesthesiology

## 2014-03-12 DIAGNOSIS — Z01818 Encounter for other preprocedural examination: Secondary | ICD-10-CM | POA: Insufficient documentation

## 2014-03-12 DIAGNOSIS — Z01812 Encounter for preprocedural laboratory examination: Secondary | ICD-10-CM | POA: Insufficient documentation

## 2014-03-12 DIAGNOSIS — Z01810 Encounter for preprocedural cardiovascular examination: Secondary | ICD-10-CM | POA: Insufficient documentation

## 2014-03-12 HISTORY — DX: Gastro-esophageal reflux disease without esophagitis: K21.9

## 2014-03-12 HISTORY — DX: Headache: R51

## 2014-03-12 LAB — SURGICAL PCR SCREEN
MRSA, PCR: NEGATIVE
STAPHYLOCOCCUS AUREUS: NEGATIVE

## 2014-03-12 LAB — BASIC METABOLIC PANEL
BUN: 28 mg/dL — ABNORMAL HIGH (ref 6–23)
CO2: 28 mEq/L (ref 19–32)
Calcium: 9.5 mg/dL (ref 8.4–10.5)
Chloride: 102 mEq/L (ref 96–112)
Creatinine, Ser: 1.17 mg/dL (ref 0.50–1.35)
GFR calc non Af Amer: 60 mL/min — ABNORMAL LOW (ref >90)
GFR, EST AFRICAN AMERICAN: 69 mL/min — AB (ref >90)
Glucose, Bld: 138 mg/dL — ABNORMAL HIGH (ref 70–99)
Potassium: 4.5 mEq/L (ref 3.7–5.3)
Sodium: 141 mEq/L (ref 137–147)

## 2014-03-12 LAB — URINALYSIS, ROUTINE W REFLEX MICROSCOPIC
BILIRUBIN URINE: NEGATIVE
Glucose, UA: NEGATIVE mg/dL
HGB URINE DIPSTICK: NEGATIVE
Ketones, ur: NEGATIVE mg/dL
Leukocytes, UA: NEGATIVE
Nitrite: NEGATIVE
PH: 6 (ref 5.0–8.0)
Protein, ur: 100 mg/dL — AB
Specific Gravity, Urine: 1.017 (ref 1.005–1.030)
Urobilinogen, UA: 1 mg/dL (ref 0.0–1.0)

## 2014-03-12 LAB — CBC
HEMATOCRIT: 40 % (ref 39.0–52.0)
Hemoglobin: 13.7 g/dL (ref 13.0–17.0)
MCH: 31.4 pg (ref 26.0–34.0)
MCHC: 34.3 g/dL (ref 30.0–36.0)
MCV: 91.5 fL (ref 78.0–100.0)
Platelets: 254 10*3/uL (ref 150–400)
RBC: 4.37 MIL/uL (ref 4.22–5.81)
RDW: 12.4 % (ref 11.5–15.5)
WBC: 9.5 10*3/uL (ref 4.0–10.5)

## 2014-03-12 LAB — APTT: aPTT: 28 seconds (ref 24–37)

## 2014-03-12 LAB — PROTIME-INR
INR: 1.04 (ref 0.00–1.49)
Prothrombin Time: 13.4 seconds (ref 11.6–15.2)

## 2014-03-12 LAB — URINE MICROSCOPIC-ADD ON

## 2014-03-12 NOTE — Progress Notes (Signed)
03/12/14 0819  OBSTRUCTIVE SLEEP APNEA  Have you ever been diagnosed with sleep apnea through a sleep study? No  Do you snore loudly (loud enough to be heard through closed doors)?  0  Do you often feel tired, fatigued, or sleepy during the daytime? 0  Has anyone observed you stop breathing during your sleep? 0  Do you have, or are you being treated for high blood pressure? 1  BMI more than 35 kg/m2? 1  Age over 76 years old? 1  Neck circumference greater than 40 cm/16 inches? 0  Gender: 1  Obstructive Sleep Apnea Score 4  Score 4 or greater  Results sent to PCP

## 2014-03-19 NOTE — H&P (Signed)
TOTAL HIP ADMISSION H&P  Patient is admitted for left total hip arthroplasty, anterior approach.  Subjective:  Chief Complaint: Left hip OA / pain  HPI: Julian Mcdonald, 73 y.o. male, has a history of pain and functional disability in the left hip(s) due to arthritis and patient has failed non-surgical conservative treatments for greater than 12 weeks to include NSAID's and/or analgesics, corticosteriod injections and activity modification.  Onset of symptoms was gradual starting 4-5 years ago with gradually worsening course since that time.The patient noted no past surgery on the left hip(s).  Patient currently rates pain in the left hip at 9 out of 10 with activity. Patient has worsening of pain with activity and weight bearing, trendelenberg gait, pain that interfers with activities of daily living and pain with passive range of motion. Patient has evidence of periarticular osteophytes and joint space narrowing by imaging studies. This condition presents safety issues increasing the risk of falls.  There is no current active infection.  Risks, benefits and expectations were discussed with the patient.  Risks including but not limited to the risk of anesthesia, blood clots, nerve damage, blood vessel damage, failure of the prosthesis, infection and up to and including death.  Patient understand the risks, benefits and expectations and wishes to proceed with surgery.   PCP: Nyoka Cowden, MD  D/C Plans:      Home with HHPT  Post-op Meds:       No Rx given   Tranexamic Acid:      Is to be given  Decadron:      Is to be given  FYI:     ASA post-op  Norco post-op   Patient Active Problem List   Diagnosis Date Noted  . HYPERLIPIDEMIA 06/19/2010  . HYPERTENSION 06/19/2010  . ASTHMA 06/19/2010  . OSTEOARTHRITIS 06/19/2010  . LOW BACK PAIN 06/19/2010  . COLONIC POLYPS, HX OF 06/19/2010   Past Medical History  Diagnosis Date  . COLONIC POLYPS, HX OF 06/19/2010  .  HYPERLIPIDEMIA 06/19/2010  . HYPERTENSION 06/19/2010  . LOW BACK PAIN 06/19/2010  . OSTEOARTHRITIS 06/19/2010  . Morbid obesity   . ASTHMA     x1 event  . GERD (gastroesophageal reflux disease)     occasional  . Headache(784.0)     hx of, none in 10 years    Past Surgical History  Procedure Laterality Date  . Total knee arthroplasty    . Lumbar laminectomy  2011  . Colonoscopy      next in August  . Tonsillectomy  1948  . Joint replacement Bilateral 2010, 2012    knees  . Trigeminal nerve decompression  1976    "trigeminal neuralgia"    No prescriptions prior to admission   Allergies  Allergen Reactions  . Morphine Hives    Rash and "goes crazy"    History  Substance Use Topics  . Smoking status: Former Smoker -- 40 years    Types: Cigarettes    Quit date: 10/01/1996  . Smokeless tobacco: Never Used  . Alcohol Use: Yes     Comment: social    No family history on file.   Review of Systems  Constitutional: Negative.   Eyes: Negative.   Respiratory: Negative.   Cardiovascular: Negative.   Gastrointestinal: Positive for heartburn.  Genitourinary: Positive for frequency.  Musculoskeletal: Positive for back pain and joint pain.  Skin: Negative.   Neurological: Positive for headaches.  Endo/Heme/Allergies: Negative.   Psychiatric/Behavioral: Negative.     Objective:  Physical Exam  Constitutional: He is oriented to person, place, and time. He appears well-developed and well-nourished.  HENT:  Head: Normocephalic and atraumatic.  Mouth/Throat: Oropharynx is clear and moist.  Eyes: Pupils are equal, round, and reactive to light.  Neck: Neck supple. No JVD present. No tracheal deviation present. No thyromegaly present.  Cardiovascular: Normal rate, regular rhythm, normal heart sounds and intact distal pulses.   Respiratory: Effort normal and breath sounds normal. No stridor. No respiratory distress. He has no wheezes.  GI: Soft. There is no tenderness. There is no  guarding.  Musculoskeletal:       Left hip: He exhibits decreased range of motion, decreased strength, tenderness and bony tenderness. He exhibits no swelling, no deformity and no laceration.  Lymphadenopathy:    He has no cervical adenopathy.  Neurological: He is alert and oriented to person, place, and time.  Skin: Skin is warm and dry.  Psychiatric: He has a normal mood and affect.     Labs:  Estimated body mass index is 39.41 kg/(m^2) as calculated from the following:   Height as of 01/12/14: 5\' 9"  (1.753 m).   Weight as of 01/12/14: 121.11 kg (267 lb).   Imaging Review Plain radiographs demonstrate severe degenerative joint disease of the left hip(s). The bone quality appears to be good for age and reported activity level.  Assessment/Plan:  End stage arthritis, left hip(s)  The patient history, physical examination, clinical judgement of the provider and imaging studies are consistent with end stage degenerative joint disease of the left hip(s) and total hip arthroplasty is deemed medically necessary. The treatment options including medical management, injection therapy, arthroscopy and arthroplasty were discussed at length. The risks and benefits of total hip arthroplasty were presented and reviewed. The risks due to aseptic loosening, infection, stiffness, dislocation/subluxation,  thromboembolic complications and other imponderables were discussed.  The patient acknowledged the explanation, agreed to proceed with the plan and consent was signed. Patient is being admitted for inpatient treatment for surgery, pain control, PT, OT, prophylactic antibiotics, VTE prophylaxis, progressive ambulation and ADL's and discharge planning.The patient is planning to be discharged home with home health services.      West Pugh Babish   PA-C  03/19/2014, 2:16 PM

## 2014-03-22 MED ORDER — DEXTROSE 5 % IV SOLN
3.0000 g | INTRAVENOUS | Status: AC
  Administered 2014-03-23: 2 g via INTRAVENOUS
  Filled 2014-03-22 (×2): qty 3000

## 2014-03-23 ENCOUNTER — Inpatient Hospital Stay (HOSPITAL_COMMUNITY): Payer: Medicare Other

## 2014-03-23 ENCOUNTER — Encounter (HOSPITAL_COMMUNITY)
Admission: RE | Disposition: A | Discharge status: Home Health | Payer: Self-pay | Source: Ambulatory Visit | Attending: Orthopedic Surgery

## 2014-03-23 ENCOUNTER — Encounter (HOSPITAL_COMMUNITY): Payer: Self-pay | Admitting: *Deleted

## 2014-03-23 ENCOUNTER — Encounter (HOSPITAL_COMMUNITY): Payer: Medicare Other | Admitting: Anesthesiology

## 2014-03-23 ENCOUNTER — Inpatient Hospital Stay (HOSPITAL_COMMUNITY)
Admission: RE | Admit: 2014-03-23 | Discharge: 2014-03-24 | DRG: 470 | Disposition: A | Discharge status: Home Health | Payer: Medicare Other | Source: Ambulatory Visit | Attending: Orthopedic Surgery | Admitting: Orthopedic Surgery

## 2014-03-23 ENCOUNTER — Inpatient Hospital Stay (HOSPITAL_COMMUNITY): Payer: Medicare Other | Admitting: Anesthesiology

## 2014-03-23 DIAGNOSIS — M169 Osteoarthritis of hip, unspecified: Principal | ICD-10-CM | POA: Diagnosis present

## 2014-03-23 DIAGNOSIS — I10 Essential (primary) hypertension: Secondary | ICD-10-CM | POA: Diagnosis present

## 2014-03-23 DIAGNOSIS — Z96659 Presence of unspecified artificial knee joint: Secondary | ICD-10-CM

## 2014-03-23 DIAGNOSIS — K219 Gastro-esophageal reflux disease without esophagitis: Secondary | ICD-10-CM | POA: Diagnosis present

## 2014-03-23 DIAGNOSIS — Z87891 Personal history of nicotine dependence: Secondary | ICD-10-CM

## 2014-03-23 DIAGNOSIS — M1610 Unilateral primary osteoarthritis, unspecified hip: Principal | ICD-10-CM | POA: Diagnosis present

## 2014-03-23 DIAGNOSIS — Z860100 Personal history of colon polyps, unspecified: Secondary | ICD-10-CM

## 2014-03-23 DIAGNOSIS — Z96649 Presence of unspecified artificial hip joint: Secondary | ICD-10-CM

## 2014-03-23 DIAGNOSIS — D62 Acute posthemorrhagic anemia: Secondary | ICD-10-CM | POA: Diagnosis not present

## 2014-03-23 DIAGNOSIS — Z6838 Body mass index (BMI) 38.0-38.9, adult: Secondary | ICD-10-CM

## 2014-03-23 DIAGNOSIS — E669 Obesity, unspecified: Secondary | ICD-10-CM | POA: Diagnosis present

## 2014-03-23 DIAGNOSIS — D500 Iron deficiency anemia secondary to blood loss (chronic): Secondary | ICD-10-CM | POA: Diagnosis not present

## 2014-03-23 HISTORY — PX: TOTAL HIP ARTHROPLASTY: SHX124

## 2014-03-23 LAB — TYPE AND SCREEN
ABO/RH(D): O POS
ANTIBODY SCREEN: NEGATIVE

## 2014-03-23 SURGERY — ARTHROPLASTY, HIP, TOTAL, ANTERIOR APPROACH
Anesthesia: Spinal | Site: Hip | Laterality: Left

## 2014-03-23 MED ORDER — SUFENTANIL CITRATE 50 MCG/ML IV SOLN
INTRAVENOUS | Status: AC
  Filled 2014-03-23: qty 1

## 2014-03-23 MED ORDER — CISATRACURIUM BESYLATE 20 MG/10ML IV SOLN
INTRAVENOUS | Status: AC
  Filled 2014-03-23: qty 10

## 2014-03-23 MED ORDER — METOCLOPRAMIDE HCL 10 MG PO TABS: 5.0000 mg | ORAL_TABLET | Freq: Three times a day (TID) | ORAL | Status: DC | PRN

## 2014-03-23 MED ORDER — GLYCOPYRROLATE 0.2 MG/ML IJ SOLN
INTRAMUSCULAR | Status: DC | PRN
  Administered 2014-03-23: .2 mg via INTRAVENOUS

## 2014-03-23 MED ORDER — PROPOFOL INFUSION 10 MG/ML OPTIME
INTRAVENOUS | Status: DC | PRN
  Administered 2014-03-23: 50 ug/kg/min via INTRAVENOUS

## 2014-03-23 MED ORDER — ONDANSETRON HCL 4 MG/2ML IJ SOLN
INTRAMUSCULAR | Status: DC | PRN
  Administered 2014-03-23: 4 mg via INTRAVENOUS

## 2014-03-23 MED ORDER — SODIUM CHLORIDE 0.9 % IV SOLN
100.0000 mL/h | INTRAVENOUS | Status: DC
  Administered 2014-03-23 – 2014-03-24 (×2): 100 mL/h via INTRAVENOUS
  Filled 2014-03-23 (×4): qty 1000

## 2014-03-23 MED ORDER — DEXAMETHASONE SODIUM PHOSPHATE 10 MG/ML IJ SOLN: 10.0000 mg | Freq: Once | INTRAMUSCULAR | Status: DC

## 2014-03-23 MED ORDER — DEXAMETHASONE SODIUM PHOSPHATE 10 MG/ML IJ SOLN
10.0000 mg | Freq: Once | INTRAMUSCULAR | Status: AC
  Administered 2014-03-24: 10 mg via INTRAVENOUS
  Filled 2014-03-23 (×2): qty 1

## 2014-03-23 MED ORDER — BISACODYL 10 MG RE SUPP: 10.0000 mg | Freq: Every day | RECTAL | Status: DC | PRN

## 2014-03-23 MED ORDER — MENTHOL 3 MG MT LOZG: 1.0000 | LOZENGE | OROMUCOSAL | Status: DC | PRN

## 2014-03-23 MED ORDER — PROPOFOL 10 MG/ML IV BOLUS
INTRAVENOUS | Status: AC
  Filled 2014-03-23: qty 20

## 2014-03-23 MED ORDER — POLYETHYLENE GLYCOL 3350 17 G PO PACK
17.0000 g | PACK | Freq: Two times a day (BID) | ORAL | Status: DC
  Administered 2014-03-23 – 2014-03-24 (×3): 17 g via ORAL
  Filled 2014-03-23 (×4): qty 1

## 2014-03-23 MED ORDER — MIDAZOLAM HCL 5 MG/5ML IJ SOLN
INTRAMUSCULAR | Status: DC | PRN
  Administered 2014-03-23: 2 mg via INTRAVENOUS

## 2014-03-23 MED ORDER — DEXAMETHASONE SODIUM PHOSPHATE 10 MG/ML IJ SOLN
INTRAMUSCULAR | Status: AC
  Filled 2014-03-23: qty 1

## 2014-03-23 MED ORDER — HYDROMORPHONE HCL PF 1 MG/ML IJ SOLN: 0.2500 mg | INTRAMUSCULAR | Status: DC | PRN

## 2014-03-23 MED ORDER — DOCUSATE SODIUM 100 MG PO CAPS
100.0000 mg | ORAL_CAPSULE | Freq: Two times a day (BID) | ORAL | Status: DC
  Administered 2014-03-23 – 2014-03-24 (×3): 100 mg via ORAL
  Filled 2014-03-23 (×4): qty 1

## 2014-03-23 MED ORDER — HYDROCODONE-ACETAMINOPHEN 7.5-325 MG PO TABS
1.0000 | ORAL_TABLET | ORAL | Status: DC
  Administered 2014-03-23 (×2): 2 via ORAL
  Administered 2014-03-23: 1 via ORAL
  Administered 2014-03-24 (×2): 2 via ORAL
  Filled 2014-03-23 (×5): qty 2

## 2014-03-23 MED ORDER — BUPIVACAINE IN DEXTROSE 0.75-8.25 % IT SOLN
INTRATHECAL | Status: DC | PRN
  Administered 2014-03-23: 2 mL via INTRATHECAL

## 2014-03-23 MED ORDER — DIPHENHYDRAMINE HCL 25 MG PO CAPS: 25.0000 mg | ORAL_CAPSULE | Freq: Four times a day (QID) | ORAL | Status: DC | PRN

## 2014-03-23 MED ORDER — PHENOL 1.4 % MT LIQD: 1.0000 | OROMUCOSAL | Status: DC | PRN

## 2014-03-23 MED ORDER — SODIUM CHLORIDE 0.9 % IJ SOLN
INTRAMUSCULAR | Status: AC
  Filled 2014-03-23: qty 10

## 2014-03-23 MED ORDER — ALUM & MAG HYDROXIDE-SIMETH 200-200-20 MG/5ML PO SUSP: 30.0000 mL | ORAL | Status: DC | PRN

## 2014-03-23 MED ORDER — 0.9 % SODIUM CHLORIDE (POUR BTL) OPTIME
TOPICAL | Status: DC | PRN
  Administered 2014-03-23: 1000 mL

## 2014-03-23 MED ORDER — DILTIAZEM HCL 60 MG PO TABS
120.0000 mg | ORAL_TABLET | Freq: Every day | ORAL | Status: DC
  Administered 2014-03-23: 120 mg via ORAL
  Filled 2014-03-23 (×2): qty 2

## 2014-03-23 MED ORDER — SIMVASTATIN 40 MG PO TABS
40.0000 mg | ORAL_TABLET | Freq: Every evening | ORAL | Status: DC
  Administered 2014-03-23: 40 mg via ORAL
  Filled 2014-03-23 (×2): qty 1

## 2014-03-23 MED ORDER — TRANEXAMIC ACID 100 MG/ML IV SOLN
1000.0000 mg | Freq: Once | INTRAVENOUS | Status: DC
  Filled 2014-03-23: qty 10

## 2014-03-23 MED ORDER — DILTIAZEM HCL 60 MG PO TABS
120.0000 mg | ORAL_TABLET | Freq: Two times a day (BID) | ORAL | Status: DC
Start: 2014-03-23 — End: 2014-03-23

## 2014-03-23 MED ORDER — ONDANSETRON HCL 4 MG/2ML IJ SOLN
INTRAMUSCULAR | Status: AC
  Filled 2014-03-23: qty 2

## 2014-03-23 MED ORDER — EPHEDRINE SULFATE 50 MG/ML IJ SOLN
INTRAMUSCULAR | Status: DC | PRN
  Administered 2014-03-23 (×2): 10 mg via INTRAVENOUS

## 2014-03-23 MED ORDER — LACTATED RINGERS IV SOLN
INTRAVENOUS | Status: AC
  Administered 2014-03-23: 12:00:00 via INTRAVENOUS
  Administered 2014-03-23: 1000 mL via INTRAVENOUS

## 2014-03-23 MED ORDER — ONDANSETRON HCL 4 MG/2ML IJ SOLN: 4.0000 mg | Freq: Four times a day (QID) | INTRAMUSCULAR | Status: DC | PRN

## 2014-03-23 MED ORDER — LACTATED RINGERS IV SOLN
INTRAVENOUS | Status: DC
  Administered 2014-03-23: 14:00:00 via INTRAVENOUS

## 2014-03-23 MED ORDER — PROPOFOL 10 MG/ML IV BOLUS
INTRAVENOUS | Status: DC | PRN
  Administered 2014-03-23: 20 mg via INTRAVENOUS

## 2014-03-23 MED ORDER — PROMETHAZINE HCL 25 MG/ML IJ SOLN: 6.2500 mg | INTRAMUSCULAR | Status: DC | PRN

## 2014-03-23 MED ORDER — MIDAZOLAM HCL 2 MG/2ML IJ SOLN
INTRAMUSCULAR | Status: AC
  Filled 2014-03-23: qty 2

## 2014-03-23 MED ORDER — ONDANSETRON HCL 4 MG PO TABS
4.0000 mg | ORAL_TABLET | Freq: Four times a day (QID) | ORAL | Status: DC | PRN
Start: 2014-03-23 — End: 2014-03-24

## 2014-03-23 MED ORDER — HYDROMORPHONE HCL PF 1 MG/ML IJ SOLN: 0.5000 mg | INTRAMUSCULAR | Status: DC | PRN

## 2014-03-23 MED ORDER — CELECOXIB 200 MG PO CAPS
200.0000 mg | ORAL_CAPSULE | Freq: Two times a day (BID) | ORAL | Status: DC
  Administered 2014-03-23 – 2014-03-24 (×2): 200 mg via ORAL
  Filled 2014-03-23 (×3): qty 1

## 2014-03-23 MED ORDER — ASPIRIN EC 325 MG PO TBEC
325.0000 mg | DELAYED_RELEASE_TABLET | Freq: Two times a day (BID) | ORAL | Status: DC
  Administered 2014-03-24: 325 mg via ORAL
  Filled 2014-03-23 (×3): qty 1

## 2014-03-23 MED ORDER — LIDOCAINE HCL (CARDIAC) 20 MG/ML IV SOLN
INTRAVENOUS | Status: AC
  Filled 2014-03-23: qty 5

## 2014-03-23 MED ORDER — METOCLOPRAMIDE HCL 5 MG/ML IJ SOLN: 5.0000 mg | Freq: Three times a day (TID) | INTRAMUSCULAR | Status: DC | PRN

## 2014-03-23 MED ORDER — METHOCARBAMOL 500 MG PO TABS: 500.0000 mg | ORAL_TABLET | Freq: Four times a day (QID) | ORAL | Status: DC | PRN

## 2014-03-23 MED ORDER — DILTIAZEM HCL 90 MG PO TABS
240.0000 mg | ORAL_TABLET | Freq: Every day | ORAL | Status: DC
  Administered 2014-03-24: 240 mg via ORAL
  Filled 2014-03-23: qty 1

## 2014-03-23 MED ORDER — DEXAMETHASONE SODIUM PHOSPHATE 10 MG/ML IJ SOLN
INTRAMUSCULAR | Status: DC | PRN
  Administered 2014-03-23: 10 mg via INTRAVENOUS

## 2014-03-23 MED ORDER — MAGNESIUM CITRATE PO SOLN: 1.0000 | Freq: Once | ORAL | Status: AC | PRN

## 2014-03-23 MED ORDER — DEXTROSE 5 % IV SOLN
500.0000 mg | Freq: Four times a day (QID) | INTRAVENOUS | Status: DC | PRN
  Filled 2014-03-23: qty 5

## 2014-03-23 MED ORDER — LIDOCAINE HCL (CARDIAC) 20 MG/ML IV SOLN
INTRAVENOUS | Status: DC | PRN
  Administered 2014-03-23: 30 mg via INTRAVENOUS

## 2014-03-23 MED ORDER — CEFAZOLIN SODIUM-DEXTROSE 2-3 GM-% IV SOLR
2.0000 g | Freq: Four times a day (QID) | INTRAVENOUS | Status: AC
  Administered 2014-03-23 (×2): 2 g via INTRAVENOUS
  Filled 2014-03-23 (×2): qty 50

## 2014-03-23 MED ORDER — FERROUS SULFATE 325 (65 FE) MG PO TABS
325.0000 mg | ORAL_TABLET | Freq: Three times a day (TID) | ORAL | Status: DC
  Administered 2014-03-23 – 2014-03-24 (×4): 325 mg via ORAL
  Filled 2014-03-23 (×6): qty 1

## 2014-03-23 SURGICAL SUPPLY — 38 items
BAG ZIPLOCK 12X15 (MISCELLANEOUS) IMPLANT
CAPT HIP PF MOP ×3 IMPLANT
COVER PERINEAL POST (MISCELLANEOUS) ×3 IMPLANT
DERMABOND ADVANCED (GAUZE/BANDAGES/DRESSINGS) ×2
DERMABOND ADVANCED .7 DNX12 (GAUZE/BANDAGES/DRESSINGS) ×1 IMPLANT
DRAPE C-ARM 42X120 X-RAY (DRAPES) ×3 IMPLANT
DRAPE LG THREE QUARTER DISP (DRAPES) ×3 IMPLANT
DRAPE STERI IOBAN 125X83 (DRAPES) ×3 IMPLANT
DRAPE U-SHAPE 47X51 STRL (DRAPES) ×9 IMPLANT
DRSG AQUACEL AG ADV 3.5X10 (GAUZE/BANDAGES/DRESSINGS) ×3 IMPLANT
DURAPREP 26ML APPLICATOR (WOUND CARE) ×3 IMPLANT
ELECT BLADE TIP CTD 4 INCH (ELECTRODE) ×3 IMPLANT
ELECT REM PT RETURN 9FT ADLT (ELECTROSURGICAL) ×3
ELECTRODE REM PT RTRN 9FT ADLT (ELECTROSURGICAL) ×1 IMPLANT
FACESHIELD WRAPAROUND (MASK) ×12 IMPLANT
GLOVE BIOGEL PI IND STRL 7.5 (GLOVE) ×1 IMPLANT
GLOVE BIOGEL PI IND STRL 8 (GLOVE) ×1 IMPLANT
GLOVE BIOGEL PI INDICATOR 7.5 (GLOVE) ×2
GLOVE BIOGEL PI INDICATOR 8 (GLOVE) ×2
GLOVE ECLIPSE 8.0 STRL XLNG CF (GLOVE) ×3 IMPLANT
GLOVE ORTHO TXT STRL SZ7.5 (GLOVE) ×6 IMPLANT
GOWN SPEC L3 XXLG W/TWL (GOWN DISPOSABLE) ×6 IMPLANT
GOWN STRL REUS W/TWL LRG LVL3 (GOWN DISPOSABLE) ×3 IMPLANT
GOWN STRL REUS W/TWL XL LVL3 (GOWN DISPOSABLE) ×3 IMPLANT
HOLDER FOLEY CATH W/STRAP (MISCELLANEOUS) ×3 IMPLANT
KIT BASIN OR (CUSTOM PROCEDURE TRAY) ×3 IMPLANT
PACK TOTAL JOINT (CUSTOM PROCEDURE TRAY) ×3 IMPLANT
PADDING CAST COTTON 6X4 STRL (CAST SUPPLIES) ×3 IMPLANT
SAW OSC TIP CART 19.5X105X1.3 (SAW) ×3 IMPLANT
SUT MNCRL AB 4-0 PS2 18 (SUTURE) ×3 IMPLANT
SUT VIC AB 1 CT1 36 (SUTURE) ×9 IMPLANT
SUT VIC AB 2-0 CT1 27 (SUTURE) ×6
SUT VIC AB 2-0 CT1 TAPERPNT 27 (SUTURE) ×2 IMPLANT
SUT VLOC 180 0 24IN GS25 (SUTURE) ×3 IMPLANT
TOWEL OR 17X26 10 PK STRL BLUE (TOWEL DISPOSABLE) ×3 IMPLANT
TOWEL OR NON WOVEN STRL DISP B (DISPOSABLE) ×3 IMPLANT
TRAY FOLEY CATH 16FRSI W/METER (SET/KITS/TRAYS/PACK) ×3 IMPLANT
WATER STERILE IRR 1500ML POUR (IV SOLUTION) ×3 IMPLANT

## 2014-03-23 NOTE — Op Note (Signed)
NAME:  Julian Mcdonald                ACCOUNT NO.: [id number]      MEDICAL RECORD NO.: [id number]      FACILITY:  [facility]      PHYSICIAN:  Paralee Cancel D  DATE OF BIRTH:  1941/07/16     DATE OF PROCEDURE:  03/23/2014                                 OPERATIVE REPORT         PREOPERATIVE DIAGNOSIS: Left  hip osteoarthritis.      POSTOPERATIVE DIAGNOSIS:  Left hip osteoarthritis.      PROCEDURE:  Left total hip replacement through an anterior approach   utilizing DePuy THR system, component size 7mm pinnacle cup, a size 36+4 neutral   Altrex liner, a size 5 Hi Tri Lock stem with a 36+5  Articuleze metal head ball.      SURGEON:  Pietro Cassis. Alvan Dame, M.D.      ASSISTANT:  Danae Orleans, PA-C     ANESTHESIA:  Spinal.      SPECIMENS:  None.      COMPLICATIONS:  None.      BLOOD LOSS:  375 cc     DRAINS:  None      INDICATION OF THE PROCEDURE:  Julian Mcdonald is a 73 y.o. male who had   presented to office for evaluation of left hip pain.  Radiographs revealed   progressive degenerative changes with bone-on-bone   articulation to the  hip joint.  The patient had painful limited range of   motion significantly affecting their overall quality of life.  The patient was failing to    respond to conservative measures, and at this point was ready   to proceed with more definitive measures.  The patient has noted progressive   degenerative changes in his hip, progressive problems and dysfunction   with regarding the hip prior to surgery.  Consent was obtained for   benefit of pain relief.  Specific risk of infection, DVT, component   failure, dislocation, need for revision surgery, as well discussion of   the anterior versus posterior approach were reviewed.  Consent was   obtained for benefit of anterior pain relief through an anterior   approach.      PROCEDURE IN DETAIL:  The patient was brought to operative theater.   Once adequate anesthesia,  preoperative antibiotics, 2gm of Ancef administered.   The patient was positioned supine on the OSI Hanna table.  Once adequate   padding of boney process was carried out, we had predraped out the hip, and  used fluoroscopy to confirm orientation of the pelvis and position.      The left hip was then prepped and draped from proximal iliac crest to   mid thigh with shower curtain technique.      Time-out was performed identifying the patient, planned procedure, and   extremity.     An incision was then made 2 cm distal and lateral to the   anterior superior iliac spine extending over the orientation of the   tensor fascia lata muscle and sharp dissection was carried down to the   fascia of the muscle and protractor placed in the soft tissues.      The fascia was then incised.  The muscle belly was identified and swept  laterally and retractor placed along the superior neck.  Following   cauterization of the circumflex vessels and removing some pericapsular   fat, a second cobra retractor was placed on the inferior neck.  A third   retractor was placed on the anterior acetabulum after elevating the   anterior rectus.  A L-capsulotomy was along the line of the   superior neck to the trochanteric fossa, then extended proximally and   distally.  Tag sutures were placed and the retractors were then placed   intracapsular.  We then identified the trochanteric fossa and   orientation of my neck cut, confirmed this radiographically   and then made a neck osteotomy with the femur on traction.  The femoral   head was removed without difficulty or complication.  Traction was let   off and retractors were placed posterior and anterior around the   acetabulum.      The labrum and foveal tissue were debrided.  I began reaming with a 48mm   reamer and reamed up to 6mm reamer with good bony bed preparation and a 35mm   cup was chosen.  The final 66mm Pinnacle cup was then impacted under fluoroscopy   to confirm the depth of penetration and orientation with respect to   abduction.  A screw was placed followed by the hole eliminator.  The final   36+4 neutral Altrex liner was impacted with good visualized rim fit.  The cup was positioned anatomically within the acetabular portion of the pelvis.      At this point, the femur was rolled at 80 degrees.  Further capsule was   released off the inferior aspect of the femoral neck.  I then   released the superior capsule proximally.  The hook was placed laterally   along the femur and elevated manually and held in position with the bed   hook.  The leg was then extended and adducted with the leg rolled to 100   degrees of external rotation.  Once the proximal femur was fully   exposed, I used a box osteotome to set orientation.  I then began   broaching with the starting chili pepper broach and passed this by hand and then broached up to 5.  With the 5 broach in place I chose a high offset neck and did trial reductions.  The offset was appropriate, leg lengths   appeared to be equal more so with the +5 head ball than the +1.5, confirmed radiographically.   Given these findings, I went ahead and dislocated the hip, repositioned all   retractors and positioned the right hip in the extended and abducted position.  The final 5 Hi Tri Lock stem was   chosen and it was impacted down to the level of neck cut.  Based on this   and the trial reduction, a 36+5 Articuleze metal head was chosen and   impacted onto a clean and dry trunnion, and the hip was reduced.  The   hip had been irrigated throughout the case again at this point.  I did   reapproximate the superior capsular leaflet to the anterior leaflet   using #1 Vicryl.  The fascia of the   tensor fascia lata muscle was then reapproximated using #1 Vicryl.  The   remaining wound was closed with 2-0 Vicryl and running 4-0 Monocryl.   The hip was cleaned, dried, and dressed sterilely using Dermabond  and   Aquacel dressing.  She was then brought  to recovery room in stable condition tolerating the procedure well.    Danae Orleans, PA-C was present for the entirety of the case involved from   preoperative positioning, perioperative retractor management, general   facilitation of the case, as well as primary wound closure as assistant.            Pietro Cassis Alvan Dame, M.D.        03/23/2014 1:15 PM

## 2014-03-23 NOTE — Interval H&P Note (Signed)
History and Physical Interval Note:  03/23/2014 10:30 AM  Julian Mcdonald  has presented today for surgery, with the diagnosis of LEFT HIP OA  The various methods of treatment have been discussed with the patient and family. After consideration of risks, benefits and other options for treatment, the patient has consented to  Procedure(s): LEFT TOTAL HIP ARTHROPLASTY ANTERIOR APPROACH (Left) as a surgical intervention .  The patient's history has been reviewed, patient examined, no change in status, stable for surgery.  I have reviewed the patient's chart and labs.  Questions were answered to the patient's satisfaction.     Mauri Pole

## 2014-03-23 NOTE — Anesthesia Postprocedure Evaluation (Signed)
Anesthesia Post Note  Patient: Julian Mcdonald  Procedure(s) Performed: Procedure(s) (LRB): LEFT TOTAL HIP ARTHROPLASTY ANTERIOR APPROACH (Left)  Anesthesia type: General  Patient location: PACU  Post pain: Pain level controlled  Post assessment: Post-op Vital signs reviewed  Last Vitals:  Filed Vitals:   03/23/14 1345  BP: 112/58  Pulse: 59  Temp: 36.6 C  Resp: 15    Post vital signs: Reviewed  Level of consciousness: sedated  Complications: No apparent anesthesia complications

## 2014-03-23 NOTE — Anesthesia Procedure Notes (Signed)
Spinal  Patient location during procedure: OR Start time: 03/23/2014 11:32 AM End time: 03/23/2014 11:35 AM Staffing Anesthesiologist: Freddie Apley F Performed by: anesthesiologist  Preanesthetic Checklist Completed: patient identified, site marked, surgical consent, pre-op evaluation, timeout performed, IV checked, risks and benefits discussed and monitors and equipment checked Spinal Block Patient position: sitting Prep: Betadine Patient monitoring: heart rate, continuous pulse ox and blood pressure Approach: midline Location: L2-3 Injection technique: single-shot Needle Needle type: Spinocan  Needle gauge: 22 G Needle length: 9 cm Additional Notes Expiration date of kit checked and confirmed. Patient tolerated procedure well, without complications. Negative heme, paresthesia.

## 2014-03-23 NOTE — Transfer of Care (Deleted)
Immediate Anesthesia Transfer of Care Note  Patient: Julian Mcdonald  Procedure(s) Performed: Procedure(s): LEFT TOTAL HIP ARTHROPLASTY ANTERIOR APPROACH (Left)  Patient Location: PACU  Anesthesia Type:Regional  Level of Consciousness: awake and oriented  Airway & Oxygen Therapy: Patient Spontanous Breathing and Patient connected to face mask oxygen  Post-op Assessment: Report given to PACU RN and Post -op Vital signs reviewed and stable  Post vital signs: Reviewed and stable  Complications: No apparent anesthesia complications

## 2014-03-23 NOTE — Transfer of Care (Signed)
Immediate Anesthesia Transfer of Care Note  Patient: Julian Mcdonald  Procedure(s) Performed: Procedure(s): LEFT TOTAL HIP ARTHROPLASTY ANTERIOR APPROACH (Left)  Patient Location: PACU  Anesthesia Type:Regional  Level of Consciousness: awake and oriented  Airway & Oxygen Therapy: Patient Spontanous Breathing and Patient connected to face mask oxygen  Post-op Assessment: Report given to PACU RN and Post -op Vital signs reviewed and stable  Post vital signs: Reviewed and stable  Complications: No apparent anesthesia complications

## 2014-03-23 NOTE — Anesthesia Preprocedure Evaluation (Signed)
Anesthesia Evaluation  Patient identified by MRN, date of birth, ID band Patient awake    Reviewed: Allergy & Precautions, H&P , NPO status , Patient's Chart, lab work & pertinent test results  Airway Mallampati: III TM Distance: >3 FB Neck ROM: Full    Dental  (+) Poor Dentition, Missing, Chipped, Loose, Dental Advisory Given   Pulmonary asthma , former smoker,  breath sounds clear to auscultation  Pulmonary exam normal       Cardiovascular hypertension, Pt. on medications Rhythm:Regular Rate:Normal     Neuro/Psych negative neurological ROS  negative psych ROS   GI/Hepatic Neg liver ROS, GERD-  ,  Endo/Other  Morbid obesity  Renal/GU negative Renal ROS  negative genitourinary   Musculoskeletal negative musculoskeletal ROS (+)   Abdominal (+) + obese,   Peds negative pediatric ROS (+)  Hematology negative hematology ROS (+)   Anesthesia Other Findings   Reproductive/Obstetrics                           Anesthesia Physical Anesthesia Plan  ASA: III  Anesthesia Plan: Spinal   Post-op Pain Management:    Induction: Intravenous  Airway Management Planned: Simple Face Mask  Additional Equipment:   Intra-op Plan:   Post-operative Plan:   Informed Consent: I have reviewed the patients History and Physical, chart, labs and discussed the procedure including the risks, benefits and alternatives for the proposed anesthesia with the patient or authorized representative who has indicated his/her understanding and acceptance.   Dental advisory given  Plan Discussed with: CRNA  Anesthesia Plan Comments:         Anesthesia Quick Evaluation

## 2014-03-23 NOTE — Progress Notes (Signed)
Patient admitted to rm 1317 from [facility] and orientedx4. Pain level of 2 out of 10. Kept comfortable inbed.Sandie Ano RN

## 2014-03-24 DIAGNOSIS — E669 Obesity, unspecified: Secondary | ICD-10-CM | POA: Diagnosis present

## 2014-03-24 DIAGNOSIS — D500 Iron deficiency anemia secondary to blood loss (chronic): Secondary | ICD-10-CM | POA: Diagnosis not present

## 2014-03-24 LAB — CBC
HEMATOCRIT: 34.4 % — AB (ref 39.0–52.0)
Hemoglobin: 11.8 g/dL — ABNORMAL LOW (ref 13.0–17.0)
MCH: 31 pg (ref 26.0–34.0)
MCHC: 34.3 g/dL (ref 30.0–36.0)
MCV: 90.3 fL (ref 78.0–100.0)
Platelets: 235 10*3/uL (ref 150–400)
RBC: 3.81 MIL/uL — ABNORMAL LOW (ref 4.22–5.81)
RDW: 12.2 % (ref 11.5–15.5)
WBC: 15.1 10*3/uL — ABNORMAL HIGH (ref 4.0–10.5)

## 2014-03-24 LAB — BASIC METABOLIC PANEL
BUN: 19 mg/dL (ref 6–23)
CO2: 25 mEq/L (ref 19–32)
Calcium: 8.5 mg/dL (ref 8.4–10.5)
Chloride: 99 mEq/L (ref 96–112)
Creatinine, Ser: 0.96 mg/dL (ref 0.50–1.35)
GFR, EST NON AFRICAN AMERICAN: 79 mL/min — AB (ref >90)
Glucose, Bld: 193 mg/dL — ABNORMAL HIGH (ref 70–99)
POTASSIUM: 4.6 meq/L (ref 3.7–5.3)
Sodium: 136 mEq/L — ABNORMAL LOW (ref 137–147)

## 2014-03-24 MED ORDER — POLYETHYLENE GLYCOL 3350 17 G PO PACK: 17.0000 g | PACK | Freq: Two times a day (BID) | ORAL | Status: DC

## 2014-03-24 MED ORDER — DSS 100 MG PO CAPS: 100.0000 mg | ORAL_CAPSULE | Freq: Two times a day (BID) | ORAL | Status: DC

## 2014-03-24 MED ORDER — ASPIRIN 325 MG PO TBEC: 325.0000 mg | DELAYED_RELEASE_TABLET | Freq: Two times a day (BID) | ORAL | Status: AC

## 2014-03-24 MED ORDER — HYDROCODONE-ACETAMINOPHEN 7.5-325 MG PO TABS: 1.0000 | ORAL_TABLET | ORAL | Status: DC | PRN

## 2014-03-24 MED ORDER — FERROUS SULFATE 325 (65 FE) MG PO TABS
325.0000 mg | ORAL_TABLET | Freq: Three times a day (TID) | ORAL | Status: DC
Start: 2014-03-24 — End: 2014-11-19

## 2014-03-24 MED ORDER — TIZANIDINE HCL 4 MG PO TABS: 4.0000 mg | ORAL_TABLET | Freq: Four times a day (QID) | ORAL | Status: DC | PRN

## 2014-03-24 NOTE — Progress Notes (Signed)
[facility] is providing the following services: Patient declined rw and commode - has both at home.   If patient discharges after hours, please call [phone].   Linward Headland 03/24/2014, 8:38 AM

## 2014-03-24 NOTE — Progress Notes (Signed)
Physical Therapy Treatment Patient Details Name: Julian Mcdonald MRN: [id number] DOB: 12-16-1940 Today's Date: 2014/04/20    History of Present Illness L THA, anterior approach    PT Comments    Pt progressing well.  Reviewed stairs and car transfers with pt and family  Follow Up Recommendations  Home health PT     Equipment Recommendations  None recommended by PT    Recommendations for Other Services OT consult     Precautions / Restrictions Precautions Precautions: Fall Restrictions Weight Bearing Restrictions: No Other Position/Activity Restrictions: WBAT    Mobility  Bed Mobility               General bed mobility comments: pt up in recliner upon entering room  Transfers Overall transfer level: Needs assistance Equipment used: Rolling walker (2 wheeled) Transfers: Sit to/from Stand Sit to Stand: Supervision         General transfer comment: cues for safety with RW  Ambulation/Gait Ambulation/Gait assistance: Min guard;Supervision Ambulation Distance (Feet): 200 Feet Assistive device: Rolling walker (2 wheeled) Gait Pattern/deviations: Step-to pattern;Step-through pattern;Decreased step length - right;Decreased step length - left;Shuffle;Trunk flexed     General Gait Details: cues for posture, position from RW and initial sequence.  Pt progressed to recip gait with last 60'   Stairs Stairs: Yes Stairs assistance: Min assist Stair Management: No rails;Backwards;With walker Number of Stairs: 4 General stair comments: 2 stairs twice bkwd with RW - pts dtr assisting on second attempt  Wheelchair Mobility    Modified Rankin (Stroke Patients Only)       Balance Overall balance assessment: No apparent balance deficits (not formally assessed)                                  Cognition Arousal/Alertness: Awake/alert Behavior During Therapy: WFL for tasks assessed/performed Overall Cognitive Status: Within Functional Limits  for tasks assessed                      Exercises      General Comments        Pertinent Vitals/Pain 3/10; premed, ice pack provided    Home Living               Home Equipment: Bedside commode;Walker - 2 wheels;Shower seat;Adaptive equipment      Prior Function            PT Goals (current goals can now be found in the care plan section) Acute Rehab PT Goals Patient Stated Goal: Resume previous lifestyle with decreased pain PT Goal Formulation: With patient Time For Goal Achievement: April 20, 2014 Potential to Achieve Goals: Good Progress towards PT goals: Progressing toward goals    Frequency  7X/week    PT Plan Current plan remains appropriate    Co-evaluation             End of Session Equipment Utilized During Treatment: Gait belt Activity Tolerance: Patient tolerated treatment well Patient left: in chair;with call bell/phone within reach     Time: 9371-6967 PT Time Calculation (min): 30 min  Charges:  $Gait Training: 8-22 mins $Therapeutic Activity: 8-22 mins                    G Codes:      BRADSHAW,HUNTER 2014-04-20, 3:29 PM

## 2014-03-24 NOTE — Evaluation (Signed)
Occupational Therapy Evaluation Patient Details Name: Julian Mcdonald MRN: [id number] DOB: 1940/11/30 Today's Date: 03/24/2014    History of Present Illness L THA, anterior approach   Clinical Impression   Pt is at min A level with ADL mobility, mod A with LB ADLs. Pt will have 24 hr sup/aasist at [facility] and has all necessary DME and A/E from previous ortho surgeries. All education completed and no further acute OT services indicated at this time. Pt to continue with acute PT services for functional mobility safety    Follow Up Recommendations  No OT follow up    Equipment Recommendations  None recommended by OT;Other (comment) (pt has all necessary DME and A/E at home)    Recommendations for Other Services       Precautions / Restrictions Precautions Precautions: Fall Restrictions Weight Bearing Restrictions: No Other Position/Activity Restrictions: WBAT      Mobility Bed Mobility               General bed mobility comments: pt up in recliner upon entering room  Transfers Overall transfer level: Needs assistance Equipment used: Rolling walker (2 wheeled) Transfers: Sit to/from Stand Sit to Stand: Min assist         General transfer comment: cues for safety with RW    Balance Overall balance assessment: No apparent balance deficits (not formally assessed)                                          ADL Overall ADL's : Needs assistance/impaired     Grooming: Wash/dry hands;Wash/dry face;Supervision/safety   Upper Body Bathing: Set up;Sitting   Lower Body Bathing: Moderate assistance   Upper Body Dressing : Set up;Sitting   Lower Body Dressing: Moderate assistance   Toilet Transfer: Minimal assistance;Comfort height toilet;RW;Cueing for safety   Toileting- Clothing Manipulation and Hygiene: Min guard       Functional mobility during ADLs: Minimal assistance;Rolling walker;Cueing for safety General ADL Comments: Reviewed A/E and  DME with pt/family. Pt has necessary DME and A/E at home from previous ortho surgeries     Vision  wears glasses at all times                   Perception Perception Perception Tested?: No   Praxis Praxis Praxis tested?: Not tested    Pertinent Vitals/Pain 2/10 L hip pain, VSS     Hand Dominance Right   Extremity/Trunk Assessment Upper Extremity Assessment Upper Extremity Assessment: Overall WFL for tasks assessed   Lower Extremity Assessment Lower Extremity Assessment: Defer to PT evaluation   Cervical / Trunk Assessment Cervical / Trunk Assessment: Normal   Communication Communication Communication: No difficulties   Cognition Arousal/Alertness: Awake/alert Behavior During Therapy: WFL for tasks assessed/performed Overall Cognitive Status: Within Functional Limits for tasks assessed                     General Comments   Pt very pleasant, cooperative and humorous                 Home Living Family/patient expects to be discharged to:: Private residence Living Arrangements: Spouse/significant other Available Help at Discharge: Family Type of Home: House Home Access: Stairs to enter Technical brewer of Steps: 8   Home Layout: One level     Bathroom Shower/Tub: Teacher, early years/pre: Standard  Home Equipment: Bedside commode;Walker - 2 wheels;Shower seat;Adaptive equipment Adaptive Equipment: Reacher;Long-handled shoe horn        Prior Functioning/Environment Level of Independence: Independent                                      Barriers to D/C:  none                        End of Session Equipment Utilized During Treatment: Rolling walker;Other (comment) (3 in 1 over toilet)  Activity Tolerance:   Patient left: in chair;with call bell/phone within reach;with family/visitor present   Time: 9563-8756 OT Time Calculation (min): 22 min Charges:  OT General Charges $OT Visit: 1  Procedure OT Evaluation $Initial OT Evaluation Tier I: 1 Procedure OT Treatments $Therapeutic Activity: 8-22 mins G-Codes:    Britt Bottom 03/24/2014, 1:15 PM

## 2014-03-24 NOTE — Progress Notes (Signed)
Discharged from floor via w/c, family with pt. No changes in assessment. Council, Taylor  

## 2014-03-24 NOTE — Evaluation (Signed)
Physical Therapy Evaluation Patient Details Name: Julian Mcdonald MRN: [id number] DOB: 1940-11-02 Today's Date: 03/24/2014   History of Present Illness     Clinical Impression  Pt s/p L THR presents with decreased L LE strength/ROM and post op pain limiting functional mobility.  Pt should progress well to d/c home with family assist and HHPT follow up    Follow Up Recommendations Home health PT    Equipment Recommendations  None recommended by PT    Recommendations for Other Services OT consult     Precautions / Restrictions Precautions Precautions: Fall Restrictions Weight Bearing Restrictions: No Other Position/Activity Restrictions: WBAT      Mobility  Bed Mobility Overal bed mobility: Needs Assistance Bed Mobility: Supine to Sit     Supine to sit: Min assist     General bed mobility comments: cues for sequence and use of R LE to self assist. Min assist to manage L LE  Transfers Overall transfer level: Needs assistance Equipment used: Rolling walker (2 wheeled) Transfers: Sit to/from Stand Sit to Stand: Min assist         General transfer comment: cues for LE management and use of UEs to self assist  Ambulation/Gait Ambulation/Gait assistance: Min assist;Min guard Ambulation Distance (Feet): 123 Feet Assistive device: Rolling walker (2 wheeled) Gait Pattern/deviations: Step-to pattern;Step-through pattern;Decreased step length - right;Decreased step length - left;Shuffle;Trunk flexed     General Gait Details: cues for posture, position from RW and initial sequence.  Pt progressed to recip gait with last 18'  Stairs            Wheelchair Mobility    Modified Rankin (Stroke Patients Only)       Balance                                             Pertinent Vitals/Pain 5/10; premed, ice pack provided    Home Living Family/patient expects to be discharged to:: Private residence Living Arrangements: Spouse/significant  other Available Help at Discharge: Family Type of Home: House Home Access: Stairs to enter Entrance Stairs-Rails: Right Entrance Stairs-Number of Steps: 8 Home Layout: One level Home Equipment: Bedside commode;Walker - 2 wheels;Shower seat;Adaptive equipment      Prior Function Level of Independence: Independent               Hand Dominance   Dominant Hand: Right    Extremity/Trunk Assessment   Upper Extremity Assessment: Overall WFL for tasks assessed           Lower Extremity Assessment: LLE deficits/detail   LLE Deficits / Details: Hip strength 2+/5 with AAROM at hip to 90 flex and 20 abd  Cervical / Trunk Assessment: Normal  Communication   Communication: No difficulties  Cognition Arousal/Alertness: Awake/alert Behavior During Therapy: WFL for tasks assessed/performed Overall Cognitive Status: Within Functional Limits for tasks assessed                      General Comments      Exercises Total Joint Exercises Ankle Circles/Pumps: AROM;Both;15 reps;Supine Quad Sets: AROM;Both;10 reps;Supine Heel Slides: AAROM;Left;20 reps;Supine Hip ABduction/ADduction: AAROM;Left;15 reps;Supine      Assessment/Plan    PT Assessment Patient needs continued PT services  PT Diagnosis Difficulty walking   PT Problem List Decreased strength;Decreased range of motion;Decreased activity tolerance;Decreased mobility;Decreased knowledge of use of DME;Pain  PT Treatment  Interventions DME instruction;Gait training;Stair training;Functional mobility training;Therapeutic activities;Therapeutic exercise;Patient/family education   PT Goals (Current goals can be found in the Care Plan section) Acute Rehab PT Goals Patient Stated Goal: Resume previous lifestyle with decreased pain PT Goal Formulation: With patient Time For Goal Achievement: 03/24/14 Potential to Achieve Goals: Good    Frequency 7X/week   Barriers to discharge        Co-evaluation                End of Session Equipment Utilized During Treatment: Gait belt Activity Tolerance: Patient tolerated treatment well Patient left: in chair;with call bell/phone within reach Nurse Communication: Mobility status         Time: 0810-0845 PT Time Calculation (min): 35 min   Charges:   PT Evaluation $Initial PT Evaluation Tier I: 1 Procedure PT Treatments $Gait Training: 8-22 mins $Therapeutic Exercise: 8-22 mins   PT G Codes:          BRADSHAW,HUNTER 03/24/2014, 11:28 AM

## 2014-03-24 NOTE — Plan of Care (Signed)
Problem: Discharge Progression Outcomes Goal: Anticoagulant follow-up in place Outcome: Not Applicable Date Met:  11/55/20 asa

## 2014-03-24 NOTE — Progress Notes (Signed)
   Subjective: 1 Day Post-Op Procedure(s) (LRB): LEFT TOTAL HIP ARTHROPLASTY ANTERIOR APPROACH (Left)   Patient reports pain as mild, pain controlled. No events throughout the night. Ready to be discharged home if they do well with PT and pain stays controlled.  Objective:   VITALS:   Filed Vitals:   03/24/14  BP: 162/97  Pulse: 80  Temp: 97.4 F (36.3 C)   Resp: 16    Neurovascular intact Dorsiflexion/Plantar flexion intact Incision: dressing C/D/I No cellulitis present Compartment soft  LABS  Recent Labs  03/24/14 0523  HGB 11.8*  HCT 34.4*  WBC 15.1*  PLT 235     Recent Labs  03/24/14 0523  NA 136*  K 4.6  BUN 19  CREATININE 0.96  GLUCOSE 193*     Assessment/Plan: 1 Day Post-Op Procedure(s) (LRB): LEFT TOTAL HIP ARTHROPLASTY ANTERIOR APPROACH (Left) Foley cath d/c'ed Advance diet Up with therapy D/C IV fluids Discharge home with home health Follow up in 2 weeks at [facility] Follow up with OLIN,MATTHEW D in 2 weeks.  Contact information:  [facility] [address], [place] [phone]    Expected ABLA  Treated with iron and will observe  Obese (BMI 30-39.9) Estimated body mass index is 38.02 kg/(m^2) as calculated from the following:   Height as of this encounter: 5\' 10"  (1.778 m).   Weight as of this encounter: 120.203 kg (265 lb). Patient also counseled that weight may inhibit the healing process Patient counseled that losing weight will help with future health issues      West Pugh. Babish   PAC  03/24/2014, 8:56 AM

## 2014-03-25 NOTE — Discharge Summary (Signed)
Physician Discharge Summary  Patient ID: Julian Mcdonald MRN: [id number] DOB/AGE: Apr 17, 1941 73 y.o.  Admit date: 03/23/2014 Discharge date: 03/24/2014   Procedures:  Procedure(s) (LRB): LEFT TOTAL HIP ARTHROPLASTY ANTERIOR APPROACH (Left)  Attending Physician:  Dr. Paralee Cancel   Admission Diagnoses:   Left hip OA / pain  Discharge Diagnoses:  Principal Problem:   S/P left THA, AA Active Problems:   Obese   Expected blood loss anemia  Past Medical History  Diagnosis Date  . COLONIC POLYPS, HX OF 06/19/2010  . HYPERLIPIDEMIA 06/19/2010  . HYPERTENSION 06/19/2010  . LOW BACK PAIN 06/19/2010  . OSTEOARTHRITIS 06/19/2010  . Morbid obesity   . ASTHMA     x1 event  . GERD (gastroesophageal reflux disease)     occasional  . Headache(784.0)     hx of, none in 10 years    HPI: Julian Mcdonald, 72 y.o. male, has a history of pain and functional disability in the left hip(s) due to arthritis and patient has failed non-surgical conservative treatments for greater than 12 weeks to include NSAID's and/or analgesics, corticosteriod injections and activity modification. Onset of symptoms was gradual starting 4-5 years ago with gradually worsening course since that time.The patient noted no past surgery on the left hip(s). Patient currently rates pain in the left hip at 9 out of 10 with activity. Patient has worsening of pain with activity and weight bearing, trendelenberg gait, pain that interfers with activities of daily living and pain with passive range of motion. Patient has evidence of periarticular osteophytes and joint space narrowing by imaging studies. This condition presents safety issues increasing the risk of falls. There is no current active infection. Risks, benefits and expectations were discussed with the patient. Risks including but not limited to the risk of anesthesia, blood clots, nerve damage, blood vessel damage, failure of the prosthesis, infection and up to and  including death. Patient understand the risks, benefits and expectations and wishes to proceed with surgery.  PCP: Nyoka Cowden, MD   Discharged Condition: good  Hospital Course:  Patient underwent the above stated procedure on 03/23/2014. Patient tolerated the procedure well and brought to the recovery room in good condition and subsequently to the floor.  POD #1 BP: 162/97 ; Pulse: 80 ; Temp: 97.4 F (36.3 C) ; Resp: 16 Patient reports pain as mild, pain controlled. No events throughout the night. Ready to be discharged home.  Neurovascular intact, dorsiflexion/plantar flexion intact, incision: dressing C/D/I, no cellulitis present and compartment soft.   LABS  Basename    HGB  11.8  HCT  34.4     Discharge Exam: General appearance: alert, cooperative and no distress Extremities: Homans sign is negative, no sign of DVT, no edema, redness or tenderness in the calves or thighs and no ulcers, gangrene or trophic changes  Disposition: Home with follow up in 2 weeks   Follow-up Information   Follow up with Mauri Pole, MD. Schedule an appointment as soon as possible for a visit in 2 weeks.   Specialty:  Orthopedic Surgery   Contact information:   [address] [place] [address] [phone]       Discharge Instructions   Call MD / Call 911    Complete by:  As directed   If you experience chest pain or shortness of breath, CALL 911 and be transported to the hospital emergency room.  If you develope a fever above 101 F, pus (white drainage) or increased drainage or  redness at the wound, or calf pain, call your surgeon's office.     Change dressing    Complete by:  As directed   Maintain surgical dressing for 10-14 days, or until follow up in the clinic.     Constipation Prevention    Complete by:  As directed   Drink plenty of fluids.  Prune juice may be helpful.  You may use a stool softener, such as Colace (over the counter) 100 mg twice a  day.  Use MiraLax (over the counter) for constipation as needed.     Diet - low sodium heart healthy    Complete by:  As directed      Discharge instructions    Complete by:  As directed   Maintain surgical dressing for 10-14 days, or until follow up in the clinic. Follow up in 2 weeks at [facility]. Call with any questions or concerns.     Increase activity slowly as tolerated    Complete by:  As directed      TED hose    Complete by:  As directed   Use stockings (TED hose) for 2 weeks on both leg(s).  You may remove them at night for sleeping.     Weight bearing as tolerated    Complete by:  As directed   Laterality:  left  Extremity:  Lower             Medication List    STOP taking these medications       nabumetone 750 MG tablet  Commonly known as:  RELAFEN      TAKE these medications       aspirin 325 MG EC tablet  Take 1 tablet (325 mg total) by mouth 2 (two) times daily.     diltiazem 120 MG tablet  Commonly known as:  CARDIZEM  Take 120 mg by mouth 2 (two) times daily. Pt takes 2 tablets in the morning and one tablet in the evening     DSS 100 MG Caps  Take 100 mg by mouth 2 (two) times daily.     ferrous sulfate 325 (65 FE) MG tablet  Take 1 tablet (325 mg total) by mouth 3 (three) times daily after meals.     HYDROcodone-acetaminophen 7.5-325 MG per tablet  Commonly known as:  NORCO  Take 1-2 tablets by mouth every 4 (four) hours as needed for moderate pain.     lisinopril 40 MG tablet  Commonly known as:  PRINIVIL,ZESTRIL  Take 40 mg by mouth every morning.     polyethylene glycol packet  Commonly known as:  MIRALAX / GLYCOLAX  Take 17 g by mouth 2 (two) times daily.     simvastatin 40 MG tablet  Commonly known as:  ZOCOR  Take 40 mg by mouth every evening.     tiZANidine 4 MG tablet  Commonly known as:  ZANAFLEX  Take 1 tablet (4 mg total) by mouth every 6 (six) hours as needed for muscle spasms.         Signed: West Pugh.  Babish   PA-C  03/25/2014, 2:14 PM

## 2014-04-06 ENCOUNTER — Other Ambulatory Visit: Payer: Self-pay | Admitting: Internal Medicine

## 2014-04-19 ENCOUNTER — Telehealth: Payer: Self-pay | Admitting: Internal Medicine

## 2014-04-19 MED ORDER — DILTIAZEM HCL 120 MG PO TABS: ORAL_TABLET | ORAL | Status: DC

## 2014-04-19 MED ORDER — SIMVASTATIN 40 MG PO TABS: ORAL_TABLET | ORAL | Status: DC

## 2014-04-19 MED ORDER — LISINOPRIL 40 MG PO TABS: ORAL_TABLET | ORAL | Status: DC

## 2014-04-19 NOTE — Telephone Encounter (Signed)
Pt is waiting on mailorder  and needs 14 day supply of lisinopril 40mg  #14 , diltiazem 120 mg #42 and simvastatin 40 mg #14 call into [facility] [address]

## 2014-04-19 NOTE — Telephone Encounter (Signed)
Pt notified Rx's sent to pharmacy as requested. 

## 2014-05-22 ENCOUNTER — Other Ambulatory Visit: Payer: Self-pay | Admitting: Internal Medicine

## 2014-07-01 ENCOUNTER — Other Ambulatory Visit: Payer: Self-pay | Admitting: Internal Medicine

## 2014-09-16 ENCOUNTER — Other Ambulatory Visit: Payer: Self-pay | Admitting: Internal Medicine

## 2014-11-19 ENCOUNTER — Ambulatory Visit (INDEPENDENT_AMBULATORY_CARE_PROVIDER_SITE_OTHER): Payer: Medicare Other | Admitting: Internal Medicine

## 2014-11-19 ENCOUNTER — Encounter: Payer: Self-pay | Admitting: Internal Medicine

## 2014-11-19 VITALS — BP 130/80 | HR 68 | Temp 97.8°F | Ht 68.5 in | Wt 264.0 lb

## 2014-11-19 DIAGNOSIS — M159 Polyosteoarthritis, unspecified: Secondary | ICD-10-CM

## 2014-11-19 DIAGNOSIS — M150 Primary generalized (osteo)arthritis: Secondary | ICD-10-CM

## 2014-11-19 DIAGNOSIS — Z0000 Encounter for general adult medical examination without abnormal findings: Secondary | ICD-10-CM

## 2014-11-19 DIAGNOSIS — E785 Hyperlipidemia, unspecified: Secondary | ICD-10-CM

## 2014-11-19 DIAGNOSIS — Z86010 Personal history of colonic polyps: Secondary | ICD-10-CM

## 2014-11-19 DIAGNOSIS — I10 Essential (primary) hypertension: Secondary | ICD-10-CM

## 2014-11-19 DIAGNOSIS — Z23 Encounter for immunization: Secondary | ICD-10-CM

## 2014-11-19 LAB — COMPREHENSIVE METABOLIC PANEL
ALBUMIN: 3.9 g/dL (ref 3.5–5.2)
ALT: 15 U/L (ref 0–53)
AST: 16 U/L (ref 0–37)
Alkaline Phosphatase: 71 U/L (ref 39–117)
BILIRUBIN TOTAL: 0.4 mg/dL (ref 0.2–1.2)
BUN: 31 mg/dL — ABNORMAL HIGH (ref 6–23)
CHLORIDE: 103 meq/L (ref 96–112)
CO2: 28 mEq/L (ref 19–32)
Calcium: 9.1 mg/dL (ref 8.4–10.5)
Creatinine, Ser: 1.26 mg/dL (ref 0.40–1.50)
GFR: 59.19 mL/min — AB (ref >60.00)
GLUCOSE: 115 mg/dL — AB (ref 70–99)
POTASSIUM: 4.4 meq/L (ref 3.5–5.1)
SODIUM: 138 meq/L (ref 135–145)
Total Protein: 6.7 g/dL (ref 6.0–8.3)

## 2014-11-19 LAB — LIPID PANEL
CHOLESTEROL: 141 mg/dL (ref 0–200)
HDL: 33.8 mg/dL — AB (ref >39.00)
LDL CALC: 88 mg/dL (ref 0–99)
NONHDL: 107.2
Total CHOL/HDL Ratio: 4
Triglycerides: 95 mg/dL (ref 0.0–149.0)
VLDL: 19 mg/dL (ref 0.0–40.0)

## 2014-11-19 LAB — CBC WITH DIFFERENTIAL/PLATELET
BASOS ABS: 0.1 10*3/uL (ref 0.0–0.1)
Basophils Relative: 1 % (ref 0.0–3.0)
EOS ABS: 0.5 10*3/uL (ref 0.0–0.7)
Eosinophils Relative: 6.5 % — ABNORMAL HIGH (ref 0.0–5.0)
HCT: 35.1 % — ABNORMAL LOW (ref 39.0–52.0)
Hemoglobin: 11.9 g/dL — ABNORMAL LOW (ref 13.0–17.0)
Lymphocytes Relative: 11.2 % — ABNORMAL LOW (ref 12.0–46.0)
Lymphs Abs: 0.9 10*3/uL (ref 0.7–4.0)
MCHC: 34 g/dL (ref 30.0–36.0)
MCV: 88.7 fl (ref 78.0–100.0)
Monocytes Absolute: 0.7 10*3/uL (ref 0.1–1.0)
Monocytes Relative: 9.3 % (ref 3.0–12.0)
Neutro Abs: 5.5 10*3/uL (ref 1.4–7.7)
Neutrophils Relative %: 72 % (ref 43.0–77.0)
PLATELETS: 289 10*3/uL (ref 150.0–400.0)
RBC: 3.96 Mil/uL — ABNORMAL LOW (ref 4.22–5.81)
RDW: 13.7 % (ref 11.5–15.5)
WBC: 7.6 10*3/uL (ref 4.0–10.5)

## 2014-11-19 LAB — TSH: TSH: 1.5 u[IU]/mL (ref 0.35–4.50)

## 2014-11-19 MED ORDER — TRIAMCINOLONE ACETONIDE 0.1 % EX CREA: 1.0000 "application " | TOPICAL_CREAM | Freq: Two times a day (BID) | CUTANEOUS | Status: DC

## 2014-11-19 MED ORDER — TRIAMCINOLONE ACETONIDE 0.1 % EX CREA: 1.0000 "application " | TOPICAL_CREAM | Freq: Two times a day (BID) | CUTANEOUS | Status: AC

## 2014-11-19 NOTE — Progress Notes (Signed)
Subjective:    Patient ID: Julian Mcdonald, male    DOB: 26-Dec-1940, 74 y.o.   MRN: [id number]  HPI 15 -year-old patient who is seen today for a wellness exam.  Medical problems include treated hypertension and dyslipidemia. He has osteoarthritis and is status post bilateral knee replacement surgeries. He has exogenous obesity but presently is dieting and has had a 15 pound weight loss. No new concerns or complaints. He also has a history of colonic polyps;  his last colonoscopy was in October 2015 by Dr. Collene Mares. He had a nuclear stress test that was normal in April 2015  Allergies:  1) ! Morphine   Past History:  Past Medical History:    Asthma  Headache  Hyperlipidemia  Hypertension  Low back pain  morbid obesity  Colonic polyps, hx of  Osteoarthritis  1. Risk factors, based on past  M,S,F history-cardiovascular risk factors include hypertension and dyslipidemia.  Negative nuclear stress test April 2015  2.  Physical activities: Remains fairly active in spite of his arthritis. He is active with golf  3.  Depression/mood: No history depression or mood disorder  4.  Hearing: No deficits  5.  ADL's: Independent in all aspects of daily living  6.  Fall risk: Low  7.  Home safety: No problems identified  8.  Height weight, and visual acuity; height and weight stable no change in visual acuity  9.  Counseling: Ongoing exercise and weight loss encouraged  10. Lab orders based on risk factors: Laboratory profile including lipid panel will be reviewed  11. Referral : Consider final screening colonoscopy in 5 years  15. Care plan: Continue active diet weight loss and exercise regimen  13. Cognitive assessment: Alert and oriented with normal affect. No cognitive dysfunction.  14.  Preventive services will include annual clinical exam with primary care with screening lab.  Will consider follow-up colonoscopy in 5 years.  Patient was provided with a written and personalized  care plan  15.  Provider list updated.  Includes primary care ophthalmology and GI  Past Medical History  Diagnosis Date  . COLONIC POLYPS, HX OF 06/19/2010  . HYPERLIPIDEMIA 06/19/2010  . HYPERTENSION 06/19/2010  . LOW BACK PAIN 06/19/2010  . OSTEOARTHRITIS 06/19/2010  . Morbid obesity   . ASTHMA     x1 event  . GERD (gastroesophageal reflux disease)     occasional  . Headache(784.0)     hx of, none in 10 years    History   Social History  . Marital Status: Married    Spouse Name: N/A  . Number of Children: N/A  . Years of Education: N/A   Occupational History  . Not on file.   Social History Main Topics  . Smoking status: Former Smoker -- 40 years    Types: Cigarettes    Quit date: 10/01/1996  . Smokeless tobacco: Never Used  . Alcohol Use: Yes     Comment: social  . Drug Use: No  . Sexual Activity: Not on file   Other Topics Concern  . Not on file   Social History Narrative    Past Surgical History  Procedure Laterality Date  . Total knee arthroplasty    . Lumbar laminectomy  2011  . Colonoscopy      next in August  . Tonsillectomy  1948  . Joint replacement Bilateral 2010, 2012    knees  . Trigeminal nerve decompression  1976    "trigeminal neuralgia"  . 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  Allergies  Allergen Reactions  . Morphine Hives    Rash and "goes crazy"    Current Outpatient Prescriptions on File Prior to Visit  Medication Sig Dispense Refill  . diltiazem (CARDIZEM) 120 MG tablet TAKE 2 TABLETS EVERY MORNING  AND TAKE 1 TABLET AT BEDTIME 270 tablet 0  . docusate sodium 100 MG CAPS Take 100 mg by mouth 2 (two) times daily. 10 capsule 0  . lisinopril (PRINIVIL,ZESTRIL) 40 MG tablet TAKE 1 TABLET EVERY DAY 90 tablet 0  . nabumetone (RELAFEN) 750 MG tablet  TAKE 1 TABLET TWICE DAILY 180 tablet 0  . polyethylene glycol (MIRALAX / GLYCOLAX) packet Take 17 g by mouth 2 (two) times daily. 14 each 0  . simvastatin (ZOCOR) 40 MG tablet TAKE 1 TABLET EVERY DAY 90 tablet 0   No current facility-administered medications on file prior to visit.    BP 130/80 mmHg  Pulse 68  Temp(Src) 97.8 F (36.6 C) (Oral)  Ht 5' 8.5" (1.74 m)  Wt 264 lb (119.75 kg)  BMI 39.55 kg/m2  SpO2 98%         Review of Systems  Constitutional: Negative for fever, chills, activity change, appetite change and fatigue.  HENT: Negative for congestion, dental problem, ear pain, hearing loss, mouth sores, rhinorrhea, sinus pressure, sneezing, tinnitus, trouble swallowing and voice change.   Eyes: Negative for photophobia, pain, redness and visual disturbance.  Respiratory: Negative for apnea, cough, choking, chest tightness, shortness of breath and wheezing.   Cardiovascular: Negative for chest pain, palpitations and leg swelling.  Gastrointestinal: Positive for constipation. Negative for nausea, vomiting, abdominal pain, diarrhea, blood in stool, abdominal distention, anal bleeding and rectal pain.  Genitourinary: Negative for dysuria, urgency, frequency, hematuria, flank pain, decreased urine volume, discharge, penile swelling, scrotal swelling, difficulty urinating, genital sores and testicular pain.  Musculoskeletal: Negative for myalgias, back pain, joint swelling, arthralgias, gait problem, neck pain and neck stiffness.  Skin: Negative for color change, rash and wound.  Neurological: Negative for dizziness, tremors, seizures, syncope, facial asymmetry, speech difficulty, weakness, light-headedness, numbness and headaches.  Hematological: Negative for adenopathy. Does not bruise/bleed easily.  Psychiatric/Behavioral: Negative for suicidal ideas, hallucinations, behavioral problems, confusion, sleep disturbance, self-injury, dysphoric mood, decreased concentration and  agitation. The patient is not nervous/anxious.        Objective:   Physical Exam  Constitutional: He appears well-developed and well-nourished.  HENT:  Head: Normocephalic and atraumatic.  Right Ear: External ear normal.  Left Ear: External ear normal.  Nose: Nose normal.  Mouth/Throat: Oropharynx is clear and moist.  Xanthelasma  Eyes: Conjunctivae and EOM are normal. Pupils are equal, round, and reactive to light. No scleral icterus.  Neck: Normal range of motion. Neck supple. No JVD present. No thyromegaly present.  Cardiovascular: Regular rhythm, normal heart sounds and intact distal pulses.  Exam reveals no gallop and no friction rub.   No murmur heard. Pulmonary/Chest: Effort normal and breath sounds normal. He exhibits no tenderness.  Abdominal: Soft. Bowel sounds are normal. He exhibits no distension and no mass. There is no tenderness.  Genitourinary: Prostate normal and penis normal. Guaiac negative stool.  Musculoskeletal: Normal range of motion. He exhibits no edema or tenderness.  Status post bilateral total knee replacement surgery  Lymphadenopathy:    He  has no cervical adenopathy.  Neurological: He is alert. He has normal reflexes. No cranial nerve deficit. Coordination normal.  Skin: Skin is warm and dry. No rash noted.  Psychiatric: He has a normal mood and affect. His behavior is normal.          Assessment & Plan:   Preventive health examination Hypertension well controlled. Repeat blood pressure 130/70 Dyslipidemia. We'll review a lipid profile Osteoarthritis Obesity. Ongoing efforts at weight loss encouraged  Recheck 1 year

## 2014-11-19 NOTE — Progress Notes (Signed)
Pre visit review using our clinic review tool, if applicable. No additional management support is needed unless otherwise documented below in the visit note. 

## 2014-11-19 NOTE — Patient Instructions (Signed)
Limit your sodium (Salt) intake  You need to lose weight.  Consider a lower calorie diet and regular exercise.    It is important that you exercise regularly, at least 20 minutes 3 to 4 times per week.  If you develop chest pain or shortness of breath seek  medical attention.  Return in one year for follow-up Health Maintenance A healthy lifestyle and preventative care can promote health and wellness.  Maintain regular health, dental, and eye exams.  Eat a healthy diet. Foods like vegetables, fruits, whole grains, low-fat dairy products, and lean protein foods contain the nutrients you need and are low in calories. Decrease your intake of foods high in solid fats, added sugars, and salt. Get information about a proper diet from your health care provider, if necessary.  Regular physical exercise is one of the most important things you can do for your health. Most adults should get at least 150 minutes of moderate-intensity exercise (any activity that increases your heart rate and causes you to sweat) each week. In addition, most adults need muscle-strengthening exercises on 2 or more days a week.   Maintain a healthy weight. The body mass index (BMI) is a screening tool to identify possible weight problems. It provides an estimate of body fat based on height and weight. Your health care provider can find your BMI and can help you achieve or maintain a healthy weight. For males 20 years and older:  A BMI below 18.5 is considered underweight.  A BMI of 18.5 to 24.9 is normal.  A BMI of 25 to 29.9 is considered overweight.  A BMI of 30 and above is considered obese.  Maintain normal blood lipids and cholesterol by exercising and minimizing your intake of saturated fat. Eat a balanced diet with plenty of fruits and vegetables. Blood tests for lipids and cholesterol should begin at age 51 and be repeated every 5 years. If your lipid or cholesterol levels are high, you are over age 44, or you are  at high risk for heart disease, you may need your cholesterol levels checked more frequently.Ongoing high lipid and cholesterol levels should be treated with medicines if diet and exercise are not working.  If you smoke, find out from your health care provider how to quit. If you do not use tobacco, do not start.  Lung cancer screening is recommended for adults aged 30-80 years who are at high risk for developing lung cancer because of a history of smoking. A yearly low-dose CT scan of the lungs is recommended for people who have at least a 30-pack-year history of smoking and are current smokers or have quit within the past 15 years. A pack year of smoking is smoking an average of 1 pack of cigarettes a day for 1 year (for example, a 30-pack-year history of smoking could mean smoking 1 pack a day for 30 years or 2 packs a day for 15 years). Yearly screening should continue until the smoker has stopped smoking for at least 15 years. Yearly screening should be stopped for people who develop a health problem that would prevent them from having lung cancer treatment.  If you choose to drink alcohol, do not have more than 2 drinks per day. One drink is considered to be 12 oz (360 mL) of beer, 5 oz (150 mL) of wine, or 1.5 oz (45 mL) of liquor.  Avoid the use of street drugs. Do not share needles with anyone. Ask for help if you need support  or instructions about stopping the use of drugs.  High blood pressure causes heart disease and increases the risk of stroke. Blood pressure should be checked at least every 1-2 years. Ongoing high blood pressure should be treated with medicines if weight loss and exercise are not effective.  If you are 74-60 years old, ask your health care provider if you should take aspirin to prevent heart disease.  Diabetes screening involves taking a blood sample to check your fasting blood sugar level. This should be done once every 3 years after age 17 if you are at a normal  weight and without risk factors for diabetes. Testing should be considered at a younger age or be carried out more frequently if you are overweight and have at least 1 risk factor for diabetes.  Colorectal cancer can be detected and often prevented. Most routine colorectal cancer screening begins at the age of 68 and continues through age 1. However, your health care provider may recommend screening at an earlier age if you have risk factors for colon cancer. On a yearly basis, your health care provider may provide home test kits to check for hidden blood in the stool. A small camera at the end of a tube may be used to directly examine the colon (sigmoidoscopy or colonoscopy) to detect the earliest forms of colorectal cancer. Talk to your health care provider about this at age 49 when routine screening begins. A direct exam of the colon should be repeated every 5-10 years through age 81, unless early forms of precancerous polyps or small growths are found.  People who are at an increased risk for hepatitis B should be screened for this virus. You are considered at high risk for hepatitis B if:  You were born in a country where hepatitis B occurs often. Talk with your health care provider about which countries are considered high risk.  Your parents were born in a high-risk country and you have not received a shot to protect against hepatitis B (hepatitis B vaccine).  You have HIV or AIDS.  You use needles to inject street drugs.  You live with, or have sex with, someone who has hepatitis B.  You are a man who has sex with other men (MSM).  You get hemodialysis treatment.  You take certain medicines for conditions like cancer, organ transplantation, and autoimmune conditions.  Hepatitis C blood testing is recommended for all people born from [birth year] through 1967 and any individual with known risk factors for hepatitis C.  Healthy men should no longer receive prostate-specific antigen (PSA) blood  tests as part of routine cancer screening. Talk to your health care provider about prostate cancer screening.  Testicular cancer screening is not recommended for adolescents or adult males who have no symptoms. Screening includes self-exam, a health care provider exam, and other screening tests. Consult with your health care provider about any symptoms you have or any concerns you have about testicular cancer.  Practice safe sex. Use condoms and avoid high-risk sexual practices to reduce the spread of sexually transmitted infections (STIs).  You should be screened for STIs, including gonorrhea and chlamydia if:  You are sexually active and are younger than 24 years.  You are older than 24 years, and your health care provider tells you that you are at risk for this type of infection.  Your sexual activity has changed since you were last screened, and you are at an increased risk for chlamydia or gonorrhea. Ask your health care provider  if you are at risk.  If you are at risk of being infected with HIV, it is recommended that you take a prescription medicine daily to prevent HIV infection. This is called pre-exposure prophylaxis (PrEP). You are considered at risk if:  You are a man who has sex with other men (MSM).  You are a heterosexual man who is sexually active with multiple partners.  You take drugs by injection.  You are sexually active with a partner who has HIV.  Talk with your health care provider about whether you are at high risk of being infected with HIV. If you choose to begin PrEP, you should first be tested for HIV. You should then be tested every 3 months for as long as you are taking PrEP.  Use sunscreen. Apply sunscreen liberally and repeatedly throughout the day. You should seek shade when your shadow is shorter than you. Protect yourself by wearing long sleeves, pants, a wide-brimmed hat, and sunglasses year round whenever you are outdoors.  Tell your health care  provider of new moles or changes in moles, especially if there is a change in shape or color. Also, tell your health care provider if a mole is larger than the size of a pencil eraser.  A one-time screening for abdominal aortic aneurysm (AAA) and surgical repair of large AAAs by ultrasound is recommended for men aged 15-75 years who are current or former smokers.  Stay current with your vaccines (immunizations). Document Released: 03/15/2008 Document Revised: 09/22/2013 Document Reviewed: 02/12/2011 [facility] Patient Information 2015 [place], [place]. This information is not intended to replace advice given to you by your health care provider. Make sure you discuss any questions you have with your health care provider.

## 2014-12-23 ENCOUNTER — Other Ambulatory Visit: Payer: Self-pay | Admitting: Internal Medicine

## 2015-01-07 ENCOUNTER — Telehealth: Payer: Self-pay | Admitting: Internal Medicine

## 2015-01-07 NOTE — Telephone Encounter (Signed)
Pt received letter from [facility] stating they are not sure he should be taking these 2 meds together.  diltiazem (CARDIZEM) 120 MG tablet simvastatin (ZOCOR) 40 MG tablet pls advise

## 2015-01-10 NOTE — Telephone Encounter (Signed)
Spoke to pt told him discussed with Dr.K and he said it was fine that you are taking both medications. Pt verbalized understanding.

## 2015-03-10 ENCOUNTER — Encounter: Payer: Self-pay | Admitting: Internal Medicine

## 2015-03-10 ENCOUNTER — Ambulatory Visit (INDEPENDENT_AMBULATORY_CARE_PROVIDER_SITE_OTHER): Payer: Medicare Other | Admitting: Internal Medicine

## 2015-03-10 VITALS — BP 130/66 | HR 81 | Temp 98.4°F | Resp 20 | Ht 68.5 in | Wt 260.0 lb

## 2015-03-10 DIAGNOSIS — E785 Hyperlipidemia, unspecified: Secondary | ICD-10-CM

## 2015-03-10 DIAGNOSIS — J4520 Mild intermittent asthma, uncomplicated: Secondary | ICD-10-CM

## 2015-03-10 DIAGNOSIS — M150 Primary generalized (osteo)arthritis: Secondary | ICD-10-CM | POA: Diagnosis not present

## 2015-03-10 DIAGNOSIS — I10 Essential (primary) hypertension: Secondary | ICD-10-CM | POA: Diagnosis not present

## 2015-03-10 DIAGNOSIS — M159 Polyosteoarthritis, unspecified: Secondary | ICD-10-CM

## 2015-03-10 NOTE — Progress Notes (Signed)
Subjective:    Patient ID: Julian Mcdonald, male    DOB: 1941-09-26, 74 y.o.   MRN: [id number]  HPI  74 year old patient who is seen today with a chief complaint of fatigue.  He has had some significant constipation issues and was self treated with what sounds like 10 ounces of magnesium sulfate with nice benefit.  He now states that since his constipation has resolved.  He feels much improved.  Yesterday he was unable to complete 9 holes of golf due to the fatigue.  Today he is excited about a trip to the beach when he plans on plan.  3 consecutive days  He has treated hypertension and dyslipidemia.  He has osteoarthritis and low back pain  Past Medical History  Diagnosis Date  . COLONIC POLYPS, HX OF 06/19/2010  . HYPERLIPIDEMIA 06/19/2010  . HYPERTENSION 06/19/2010  . LOW BACK PAIN 06/19/2010  . OSTEOARTHRITIS 06/19/2010  . Morbid obesity   . ASTHMA     x1 event  . GERD (gastroesophageal reflux disease)     occasional  . Headache(784.0)     hx of, none in 10 years    History   Social History  . Marital Status: Married    Spouse Name: N/A  . Number of Children: N/A  . Years of Education: N/A   Occupational History  . Not on file.   Social History Main Topics  . Smoking status: Former Smoker -- 40 years    Types: Cigarettes    Quit date: 10/01/1996  . Smokeless tobacco: Never Used  . Alcohol Use: Yes     Comment: social  . Drug Use: No  . Sexual Activity: Not on file   Other Topics Concern  . Not on file   Social History Narrative    Past Surgical History  Procedure Laterality Date  . Total knee arthroplasty    . Lumbar laminectomy  2011  . Colonoscopy      next in August  . Tonsillectomy  1948  . Joint replacement Bilateral 2010, 2012    knees  . Trigeminal nerve decompression  1976    "trigeminal neuralgia"  . Total hip arthroplasty Left 03/23/2014    Procedure: LEFT TOTAL HIP ARTHROPLASTY ANTERIOR APPROACH;  Surgeon: Mauri Pole, MD;  Location: [facility];  Service: Orthopedics;  Laterality: Left;    No family history on file.  Allergies  Allergen Reactions  . Morphine Hives    Rash and "goes crazy"    Current Outpatient Prescriptions on File Prior to Visit  Medication Sig Dispense Refill  . diltiazem (CARDIZEM) 120 MG tablet TAKE 2 TABLETS EVERY MORNING  AND TAKE 1 TABLET AT BEDTIME 270 tablet 1  . lisinopril (PRINIVIL,ZESTRIL) 40 MG tablet TAKE 1 TABLET EVERY DAY 90 tablet 1  . nabumetone (RELAFEN) 750 MG tablet TAKE 1 TABLET TWICE DAILY 180 tablet 1  . simvastatin (ZOCOR) 40 MG tablet TAKE 1 TABLET EVERY DAY 90 tablet 1  . triamcinolone cream (KENALOG) 0.1 % Apply 1 application topically 2 (two) times daily. 60 g 2  . docusate sodium 100 MG CAPS Take 100 mg by mouth 2 (two) times daily. (Patient not taking: Reported on 03/10/2015) 10 capsule 0  . polyethylene glycol (MIRALAX / GLYCOLAX) packet Take 17 g by mouth 2 (two) times daily. (Patient not taking: Reported on 03/10/2015) 14 each 0   No current facility-administered medications on file prior to visit.    BP 130/66 mmHg  Pulse 81  Temp(Src)  98.4 F (36.9 C) (Oral)  Resp 20  Ht 5' 8.5" (1.74 m)  Wt 260 lb (117.935 kg)  BMI 38.95 kg/m2  SpO2 96%     Review of Systems  Constitutional: Positive for fatigue. Negative for fever, chills and appetite change.  HENT: Negative for congestion, dental problem, ear pain, hearing loss, sore throat, tinnitus, trouble swallowing and voice change.   Eyes: Negative for pain, discharge and visual disturbance.  Respiratory: Negative for cough, chest tightness, wheezing and stridor.   Cardiovascular: Negative for chest pain, palpitations and leg swelling.  Gastrointestinal: Positive for constipation. Negative for nausea, vomiting, abdominal pain, diarrhea, blood in stool and abdominal distention.  Genitourinary: Negative for urgency, hematuria, flank pain, discharge, difficulty urinating and genital sores.  Musculoskeletal: Negative for  myalgias, back pain, joint swelling, arthralgias, gait problem and neck stiffness.  Skin: Negative for rash.  Neurological: Positive for weakness. Negative for dizziness, syncope, speech difficulty, numbness and headaches.  Hematological: Negative for adenopathy. Does not bruise/bleed easily.  Psychiatric/Behavioral: Negative for behavioral problems and dysphoric mood. The patient is not nervous/anxious.        Objective:   Physical Exam  Constitutional: He is oriented to person, place, and time. He appears well-developed.  HENT:  Head: Normocephalic.  Right Ear: External ear normal.  Left Ear: External ear normal.  Eyes: Conjunctivae and EOM are normal.  Neck: Normal range of motion.  Cardiovascular: Normal rate and normal heart sounds.   Pulmonary/Chest: Breath sounds normal.  Abdominal: Bowel sounds are normal.  Obese soft and nontender  Musculoskeletal: Normal range of motion. He exhibits no edema or tenderness.  Neurological: He is alert and oriented to person, place, and time.  Psychiatric: He has a normal mood and affect. His behavior is normal.          Assessment & Plan:   Fatigue Constipation Essential hypertension, stable  His symptoms seem to have resolved following treatment for constipation.  Options discussed including lab update today.  It was decided to see how he does at the beach since today he feels well.  If he notes any exercise intolerance.  We'll reassess next week with screening lab

## 2015-03-10 NOTE — Patient Instructions (Signed)
Call or return to clinic prn if these symptoms worsen or fail to improve as anticipated.  Limit your sodium (Salt) intake  Please check your blood pressure on a regular basis.  If it is consistently greater than 150/90, please make an office appointment.  You need to lose weight.  Consider a lower calorie diet and regular exercise.

## 2015-03-10 NOTE — Progress Notes (Signed)
Pre visit review using our clinic review tool, if applicable. No additional management support is needed unless otherwise documented below in the visit note. 

## 2015-03-24 ENCOUNTER — Encounter: Payer: Self-pay | Admitting: Internal Medicine

## 2015-03-24 ENCOUNTER — Other Ambulatory Visit: Payer: Self-pay | Admitting: Internal Medicine

## 2015-03-24 ENCOUNTER — Ambulatory Visit (INDEPENDENT_AMBULATORY_CARE_PROVIDER_SITE_OTHER): Payer: Medicare Other | Admitting: Internal Medicine

## 2015-03-24 ENCOUNTER — Ambulatory Visit (INDEPENDENT_AMBULATORY_CARE_PROVIDER_SITE_OTHER)
Admission: RE | Admit: 2015-03-24 | Discharge: 2015-03-24 | Disposition: A | Discharge status: Home / Self Care | Payer: Medicare Other | Source: Ambulatory Visit | Attending: Internal Medicine | Admitting: Internal Medicine

## 2015-03-24 VITALS — BP 130/70 | HR 89 | Temp 98.9°F | Resp 20 | Ht 68.5 in | Wt 254.0 lb

## 2015-03-24 DIAGNOSIS — M150 Primary generalized (osteo)arthritis: Secondary | ICD-10-CM | POA: Diagnosis not present

## 2015-03-24 DIAGNOSIS — R531 Weakness: Secondary | ICD-10-CM

## 2015-03-24 DIAGNOSIS — R5382 Chronic fatigue, unspecified: Secondary | ICD-10-CM

## 2015-03-24 DIAGNOSIS — I10 Essential (primary) hypertension: Secondary | ICD-10-CM | POA: Diagnosis not present

## 2015-03-24 DIAGNOSIS — Z86010 Personal history of colonic polyps: Secondary | ICD-10-CM | POA: Diagnosis not present

## 2015-03-24 DIAGNOSIS — M159 Polyosteoarthritis, unspecified: Secondary | ICD-10-CM

## 2015-03-24 DIAGNOSIS — E785 Hyperlipidemia, unspecified: Secondary | ICD-10-CM | POA: Diagnosis not present

## 2015-03-24 DIAGNOSIS — R0602 Shortness of breath: Secondary | ICD-10-CM | POA: Diagnosis not present

## 2015-03-24 LAB — CBC WITH DIFFERENTIAL/PLATELET
Basophils Absolute: 0.1 10*3/uL (ref 0.0–0.1)
Basophils Relative: 0.7 % (ref 0.0–3.0)
Eosinophils Absolute: 0.4 10*3/uL (ref 0.0–0.7)
Eosinophils Relative: 4.1 % (ref 0.0–5.0)
HCT: 29 % — ABNORMAL LOW (ref 39.0–52.0)
Hemoglobin: 9.3 g/dL — ABNORMAL LOW (ref 13.0–17.0)
LYMPHS PCT: 8.8 % — AB (ref 12.0–46.0)
Lymphs Abs: 0.8 10*3/uL (ref 0.7–4.0)
MCHC: 32.1 g/dL (ref 30.0–36.0)
MCV: 78.7 fl (ref 78.0–100.0)
MONOS PCT: 8.6 % (ref 3.0–12.0)
Monocytes Absolute: 0.8 10*3/uL (ref 0.1–1.0)
Neutro Abs: 7.1 10*3/uL (ref 1.4–7.7)
Neutrophils Relative %: 77.8 % — ABNORMAL HIGH (ref 43.0–77.0)
PLATELETS: 508 10*3/uL — AB (ref 150.0–400.0)
RBC: 3.69 Mil/uL — ABNORMAL LOW (ref 4.22–5.81)
RDW: 15.4 % (ref 11.5–15.5)
WBC: 9.1 10*3/uL (ref 4.0–10.5)

## 2015-03-24 LAB — TSH: TSH: 1.34 u[IU]/mL (ref 0.35–4.50)

## 2015-03-24 LAB — COMPREHENSIVE METABOLIC PANEL
ALT: 11 U/L (ref 0–53)
AST: 12 U/L (ref 0–37)
Albumin: 3.7 g/dL (ref 3.5–5.2)
Alkaline Phosphatase: 82 U/L (ref 39–117)
BILIRUBIN TOTAL: 0.4 mg/dL (ref 0.2–1.2)
BUN: 17 mg/dL (ref 6–23)
CO2: 28 mEq/L (ref 19–32)
Calcium: 9.1 mg/dL (ref 8.4–10.5)
Chloride: 102 mEq/L (ref 96–112)
Creatinine, Ser: 1.17 mg/dL (ref 0.40–1.50)
GFR: 64.42 mL/min (ref >60.00)
Glucose, Bld: 132 mg/dL — ABNORMAL HIGH (ref 70–99)
Potassium: 4.7 mEq/L (ref 3.5–5.1)
SODIUM: 136 meq/L (ref 135–145)
TOTAL PROTEIN: 6.9 g/dL (ref 6.0–8.3)

## 2015-03-24 LAB — SEDIMENTATION RATE: SED RATE: 71 mm/h — AB (ref 0–22)

## 2015-03-24 NOTE — Progress Notes (Signed)
Subjective:    Patient ID: Julian Mcdonald, male    DOB: [birth year], 74 y.o.   MRN: [id number]  HPI  74 year old patient who is seen today in follow-up.  He has a history of essential hypertension and dyslipidemia.  He was seen 2 weeks ago with fatigue.  He was self treated for constipation and seemed to be improved.  He had had lab work performed in February and it was elected to observe since he seemed to be close to baseline.  He continues to have chronic fatigue.  He states that at his trip at the beach, he only played golf once rather than expected 3 rounds due to fatigue.  Yesterday he states he was unable to finish his round of golf.  He also describes some burning chest discomfort.  This does occur with activity but also occurs at rest.  He complains of some worsening shortness of breath.  He does have a history of asthma.  Past Medical History  Diagnosis Date  . COLONIC POLYPS, HX OF 06/19/2010  . HYPERLIPIDEMIA 06/19/2010  . HYPERTENSION 06/19/2010  . LOW BACK PAIN 06/19/2010  . OSTEOARTHRITIS 06/19/2010  . Morbid obesity   . ASTHMA     x1 event  . GERD (gastroesophageal reflux disease)     occasional  . Headache(784.0)     hx of, none in 10 years    History   Social History  . Marital Status: Married    Spouse Name: N/A  . Number of Children: N/A  . Years of Education: N/A   Occupational History  . Not on file.   Social History Main Topics  . Smoking status: Former Smoker -- 40 years    Types: Cigarettes    Quit date: 10/01/1996  . Smokeless tobacco: Never Used  . Alcohol Use: Yes     Comment: social  . Drug Use: No  . Sexual Activity: Not on file   Other Topics Concern  . Not on file   Social History Narrative    Past Surgical History  Procedure Laterality Date  . Total knee arthroplasty    . Lumbar laminectomy  2011  . Colonoscopy      next in August  . Tonsillectomy  1948  . Joint replacement Bilateral 2010, 2012    knees  . Trigeminal nerve  decompression  1976    "trigeminal neuralgia"  . Total hip arthroplasty Left 03/23/2014    Procedure: LEFT TOTAL HIP ARTHROPLASTY ANTERIOR APPROACH;  Surgeon: Mauri Pole, MD;  Location: [facility];  Service: Orthopedics;  Laterality: Left;    No family history on file.  Allergies  Allergen Reactions  . Morphine Hives    Rash and "goes crazy"    Current Outpatient Prescriptions on File Prior to Visit  Medication Sig Dispense Refill  . diltiazem (CARDIZEM) 120 MG tablet TAKE 2 TABLETS EVERY MORNING  AND TAKE 1 TABLET AT BEDTIME 270 tablet 1  . polyethylene glycol (MIRALAX / GLYCOLAX) packet Take 17 g by mouth 2 (two) times daily. 14 each 0  . triamcinolone cream (KENALOG) 0.1 % Apply 1 application topically 2 (two) times daily. (Patient taking differently: Apply 1 application topically as needed. ) 60 g 2  . lisinopril (PRINIVIL,ZESTRIL) 40 MG tablet TAKE 1 TABLET EVERY DAY (Patient not taking: Reported on 03/24/2015) 90 tablet 1  . nabumetone (RELAFEN) 750 MG tablet TAKE 1 TABLET TWICE DAILY (Patient not taking: Reported on 03/24/2015) 180 tablet 1  . simvastatin (ZOCOR) 40 MG  tablet TAKE 1 TABLET EVERY DAY (Patient not taking: Reported on 03/24/2015) 90 tablet 1   No current facility-administered medications on file prior to visit.    BP 130/70 mmHg  Pulse 89  Temp(Src) 98.9 F (37.2 C) (Oral)  Resp 20  Ht 5' 8.5" (1.74 m)  Wt 254 lb (115.214 kg)  BMI 38.05 kg/m2  SpO2 97%     Review of Systems  Constitutional: Positive for activity change and fatigue. Negative for fever, chills and appetite change.  HENT: Negative for congestion, dental problem, ear pain, hearing loss, sore throat, tinnitus, trouble swallowing and voice change.   Eyes: Negative for pain, discharge and visual disturbance.  Respiratory: Positive for shortness of breath. Negative for cough, chest tightness, wheezing and stridor.   Cardiovascular: Positive for chest pain. Negative for palpitations and leg  swelling.  Gastrointestinal: Negative for nausea, vomiting, abdominal pain, diarrhea, constipation, blood in stool and abdominal distention.  Genitourinary: Negative for urgency, hematuria, flank pain, discharge, difficulty urinating and genital sores.  Musculoskeletal: Negative for myalgias, back pain, joint swelling, arthralgias, gait problem and neck stiffness.  Skin: Negative for rash.  Neurological: Positive for weakness. Negative for dizziness, syncope, speech difficulty, numbness and headaches.  Hematological: Negative for adenopathy. Does not bruise/bleed easily.  Psychiatric/Behavioral: Negative for behavioral problems and dysphoric mood. The patient is not nervous/anxious.        Objective:   Physical Exam  Constitutional: He is oriented to person, place, and time. He appears well-developed.  Obese Normal blood pressure No distress  HENT:  Head: Normocephalic.  Right Ear: External ear normal.  Left Ear: External ear normal.  Eyes: Conjunctivae and EOM are normal.  Neck: Normal range of motion.  Cardiovascular: Normal rate and normal heart sounds.   Pulmonary/Chest: Breath sounds normal.  Abdominal: Bowel sounds are normal.  Musculoskeletal: Normal range of motion. He exhibits no edema or tenderness.  Neurological: He is alert and oriented to person, place, and time.  Psychiatric: He has a normal mood and affect. His behavior is normal.          Assessment & Plan:   Fatigue Nonspecific chest pain History of anemia  We'll check screening lab.  If this is negative, will schedule nuclear stress test

## 2015-03-24 NOTE — Progress Notes (Signed)
Pre visit review using our clinic review tool, if applicable. No additional management support is needed unless otherwise documented below in the visit note. 

## 2015-03-24 NOTE — Patient Instructions (Signed)
Limit your sodium (Salt) intake  Return in one month for follow-up 

## 2015-03-25 ENCOUNTER — Other Ambulatory Visit: Payer: Self-pay | Admitting: *Deleted

## 2015-03-25 MED ORDER — PREDNISONE 10 MG PO TABS: 10.0000 mg | ORAL_TABLET | Freq: Every day | ORAL | Status: DC

## 2015-03-25 NOTE — Telephone Encounter (Signed)
Rx done and the pt was informed. 

## 2015-04-18 ENCOUNTER — Ambulatory Visit (INDEPENDENT_AMBULATORY_CARE_PROVIDER_SITE_OTHER): Payer: Medicare Other | Admitting: Internal Medicine

## 2015-04-18 ENCOUNTER — Encounter: Payer: Self-pay | Admitting: Internal Medicine

## 2015-04-18 VITALS — BP 144/76 | HR 99 | Temp 98.4°F | Resp 20 | Ht 68.5 in | Wt 247.0 lb

## 2015-04-18 DIAGNOSIS — R7302 Impaired glucose tolerance (oral): Secondary | ICD-10-CM

## 2015-04-18 DIAGNOSIS — R5383 Other fatigue: Secondary | ICD-10-CM

## 2015-04-18 DIAGNOSIS — M150 Primary generalized (osteo)arthritis: Secondary | ICD-10-CM | POA: Diagnosis not present

## 2015-04-18 DIAGNOSIS — D638 Anemia in other chronic diseases classified elsewhere: Secondary | ICD-10-CM

## 2015-04-18 DIAGNOSIS — I10 Essential (primary) hypertension: Secondary | ICD-10-CM

## 2015-04-18 DIAGNOSIS — Z86010 Personal history of colonic polyps: Secondary | ICD-10-CM

## 2015-04-18 DIAGNOSIS — M159 Polyosteoarthritis, unspecified: Secondary | ICD-10-CM

## 2015-04-18 NOTE — Patient Instructions (Signed)
Limit your sodium (Salt) intake  Omeprazole 1 tablet daily  Avoids foods high in acid such as tomatoes citrus juices, and spicy foods.  Avoid eating within two hours of lying down or before exercising.  Do not overheat.  Try smaller more frequent meals.  If symptoms persist, elevate the head of her bed 12 inches while sleeping.  We'll consider GI follow-up for upper endoscopy and possible abdominal CT scanning

## 2015-04-18 NOTE — Progress Notes (Signed)
Pre visit review using our clinic review tool, if applicable. No additional management support is needed unless otherwise documented below in the visit note. 

## 2015-04-18 NOTE — Progress Notes (Signed)
Subjective:    Patient ID: Julian Mcdonald, male    DOB: 01-19-1941, 74 y.o.   MRN: [id number]  HPI  74 year old patient who has a history of colonic polyps.  He underwent follow-up colonoscopy in October of last year by Dr. Collene Mares.  He was seen recently with fatigue, shortness of breath and burning chest discomfort.  A chest x-ray and nuclear stress tests were nonrevealing.  He does have a history of asthma.  He has been referred to pulmonary medicine.    Laboratory studies revealed moderately severe anemia as well as an elevated sedimentation rate.  His chief complaint remains fatigue and poor exercise capacity.  He has been unable to play golf.  Wt Readings from Last 3 Encounters:  04/18/15 247 lb (112.038 kg)  03/24/15 254 lb (115.214 kg)  03/10/15 260 lb (117.935 kg)   .  He has had some ongoing constipation issues.  He has a long history of a ventral hernia and more recently has had upper abdominal pain that he feels is related to this ventral hernia.  Pain is aggravated by cough.  He states that he has a difficult time sleeping at night due to the discomfort. He also has some swallowing difficulty with solid foods  He was placed on 20 mg of prednisone 3 weeks ago for possible PMR, but there has been no clinical change.  Present medications include only Cardizem and prednisone  Past Medical History  Diagnosis Date  . COLONIC POLYPS, HX OF 06/19/2010  . HYPERLIPIDEMIA 06/19/2010  . HYPERTENSION 06/19/2010  . LOW BACK PAIN 06/19/2010  . OSTEOARTHRITIS 06/19/2010  . Morbid obesity   . ASTHMA     x1 event  . GERD (gastroesophageal reflux disease)     occasional  . Headache(784.0)     hx of, none in 10 years    History   Social History  . Marital Status: Married    Spouse Name: N/A  . Number of Children: N/A  . Years of Education: N/A   Occupational History  . Not on file.   Social History Main Topics  . Smoking status: Former Smoker -- 40 years    Types: Cigarettes      Quit date: 10/01/1996  . Smokeless tobacco: Never Used  . Alcohol Use: Yes     Comment: social  . Drug Use: No  . Sexual Activity: Not on file   Other Topics Concern  . Not on file   Social History Narrative    Past Surgical History  Procedure Laterality Date  . Total knee arthroplasty    . Lumbar laminectomy  2011  . Colonoscopy      next in August  . Tonsillectomy  1948  . Joint replacement Bilateral 2010, 2012    knees  . Trigeminal nerve decompression  1976    "trigeminal neuralgia"  . Total hip arthroplasty Left 03/23/2014    Procedure: LEFT TOTAL HIP ARTHROPLASTY ANTERIOR APPROACH;  Surgeon: Mauri Pole, MD;  Location: [facility];  Service: Orthopedics;  Laterality: Left;    No family history on file.  Allergies  Allergen Reactions  . Morphine Hives    Rash and "goes crazy"    Current Outpatient Prescriptions on File Prior to Visit  Medication Sig Dispense Refill  . diltiazem (CARDIZEM) 120 MG tablet TAKE 2 TABLETS EVERY MORNING  AND TAKE 1 TABLET AT BEDTIME 270 tablet 1  . lisinopril (PRINIVIL,ZESTRIL) 40 MG tablet TAKE 1 TABLET EVERY DAY (Patient not taking: Reported  on 03/24/2015) 90 tablet 1  . nabumetone (RELAFEN) 750 MG tablet TAKE 1 TABLET TWICE DAILY (Patient not taking: Reported on 03/24/2015) 180 tablet 1  . polyethylene glycol (MIRALAX / GLYCOLAX) packet Take 17 g by mouth 2 (two) times daily. (Patient not taking: Reported on 04/18/2015) 14 each 0  . predniSONE (DELTASONE) 10 MG tablet Take 1 tablet (10 mg total) by mouth daily with breakfast. (Patient not taking: Reported on 04/18/2015) 30 tablet 0  . simvastatin (ZOCOR) 40 MG tablet TAKE 1 TABLET EVERY DAY (Patient not taking: Reported on 03/24/2015) 90 tablet 1  . triamcinolone cream (KENALOG) 0.1 % Apply 1 application topically 2 (two) times daily. (Patient not taking: Reported on 04/18/2015) 60 g 2   No current facility-administered medications on file prior to visit.    BP 144/76 mmHg  Pulse 99   Temp(Src) 98.4 F (36.9 C) (Oral)  Resp 20  Ht 5' 8.5" (1.74 m)  Wt 247 lb (112.038 kg)  BMI 37.01 kg/m2  SpO2 98%     Review of Systems  Constitutional: Positive for activity change, appetite change, fatigue and unexpected weight change. Negative for fever and chills.  HENT: Negative for congestion, dental problem, ear pain, hearing loss, sore throat, tinnitus, trouble swallowing and voice change.   Eyes: Negative for pain, discharge and visual disturbance.  Respiratory: Negative for cough, chest tightness, wheezing and stridor.   Cardiovascular: Negative for chest pain, palpitations and leg swelling.  Gastrointestinal: Positive for abdominal pain. Negative for nausea, vomiting, diarrhea, constipation, blood in stool and abdominal distention.  Genitourinary: Negative for urgency, hematuria, flank pain, discharge, difficulty urinating and genital sores.  Musculoskeletal: Negative for myalgias, back pain, joint swelling, arthralgias, gait problem and neck stiffness.  Skin: Negative for rash.  Neurological: Negative for dizziness, syncope, speech difficulty, weakness, numbness and headaches.  Hematological: Negative for adenopathy. Does not bruise/bleed easily.  Psychiatric/Behavioral: Negative for behavioral problems and dysphoric mood. The patient is not nervous/anxious.        Objective:   Physical Exam  Constitutional: He is oriented to person, place, and time. He appears well-developed.  Obese Blood pressure 130/80 No distress  HENT:  Head: Normocephalic.  Right Ear: External ear normal.  Left Ear: External ear normal.  Eyes: Conjunctivae and EOM are normal.  Neck: Normal range of motion.  Cardiovascular: Normal rate and normal heart sounds.   Pulmonary/Chest: Breath sounds normal.  Abdominal: Soft. Bowel sounds are normal.  Obese, soft Large ventral Hernia in the upper abdominal area Mild epigastric tenderness  Musculoskeletal: Normal range of motion. He exhibits no  edema or tenderness.  Neurological: He is alert and oriented to person, place, and time.  Psychiatric: He has a normal mood and affect. His behavior is normal.          Assessment & Plan:   Anemia/elevated sedimentation rate/fatigue/weight loss.  Will repeat CBC and sedimentation rate as well as a ferritin level Dysphagia.  Will schedule GI follow-up for consideration of upper endoscopy and possible abdominal CT scan Constipation Remote history of tobacco use.  Discontinued tobacco products approximately 20 years ago after 30 years of intermittent tobacco use.  Recent negative chest x-ray.  We'll consider low-dose chest CT Hypertension, stable Impaired glucose tolerance.  We'll check a hemoglobin A1c

## 2015-04-19 LAB — CBC WITH DIFFERENTIAL/PLATELET
BASOS ABS: 0.1 10*3/uL (ref 0.0–0.1)
Basophils Relative: 1.1 % (ref 0.0–3.0)
EOS ABS: 0 10*3/uL (ref 0.0–0.7)
Eosinophils Relative: 0.4 % (ref 0.0–5.0)
HCT: 31.4 % — ABNORMAL LOW (ref 39.0–52.0)
Hemoglobin: 10.1 g/dL — ABNORMAL LOW (ref 13.0–17.0)
LYMPHS ABS: 0.6 10*3/uL — AB (ref 0.7–4.0)
Lymphocytes Relative: 5.3 % — ABNORMAL LOW (ref 12.0–46.0)
MCHC: 32 g/dL (ref 30.0–36.0)
MCV: 75.5 fl — ABNORMAL LOW (ref 78.0–100.0)
Monocytes Absolute: 0.4 10*3/uL (ref 0.1–1.0)
Monocytes Relative: 3.8 % (ref 3.0–12.0)
NEUTROS ABS: 10 10*3/uL — AB (ref 1.4–7.7)
Neutrophils Relative %: 89.4 % — ABNORMAL HIGH (ref 43.0–77.0)
Platelets: 583 10*3/uL — ABNORMAL HIGH (ref 150.0–400.0)
RBC: 4.16 Mil/uL — ABNORMAL LOW (ref 4.22–5.81)
RDW: 16.5 % — ABNORMAL HIGH (ref 11.5–15.5)
WBC: 11.2 10*3/uL — ABNORMAL HIGH (ref 4.0–10.5)

## 2015-04-19 LAB — FERRITIN: Ferritin: 20 ng/mL — ABNORMAL LOW (ref 22.0–322.0)

## 2015-04-19 LAB — HEMOGLOBIN A1C: Hgb A1c MFr Bld: 8.3 % — ABNORMAL HIGH (ref 4.6–6.5)

## 2015-04-19 LAB — SEDIMENTATION RATE: SED RATE: 77 mm/h — AB (ref 0–22)

## 2015-04-20 ENCOUNTER — Other Ambulatory Visit: Payer: Self-pay | Admitting: Internal Medicine

## 2015-04-20 ENCOUNTER — Telehealth: Payer: Self-pay | Admitting: Internal Medicine

## 2015-04-20 DIAGNOSIS — R1310 Dysphagia, unspecified: Secondary | ICD-10-CM

## 2015-04-20 DIAGNOSIS — D509 Iron deficiency anemia, unspecified: Secondary | ICD-10-CM

## 2015-04-20 NOTE — Telephone Encounter (Signed)
See result notes. 

## 2015-04-20 NOTE — Telephone Encounter (Signed)
Pt states he missed a call from Dr Raliegh Ip yesterday afternoon after 5 pm.

## 2015-04-21 ENCOUNTER — Other Ambulatory Visit: Payer: Self-pay | Admitting: Gastroenterology

## 2015-04-21 ENCOUNTER — Other Ambulatory Visit: Payer: Self-pay | Admitting: *Deleted

## 2015-04-21 ENCOUNTER — Ambulatory Visit (INDEPENDENT_AMBULATORY_CARE_PROVIDER_SITE_OTHER): Payer: Medicare Other | Admitting: Internal Medicine

## 2015-04-21 ENCOUNTER — Ambulatory Visit: Payer: Medicare Other | Admitting: Internal Medicine

## 2015-04-21 ENCOUNTER — Encounter: Payer: Self-pay | Admitting: Internal Medicine

## 2015-04-21 DIAGNOSIS — I10 Essential (primary) hypertension: Secondary | ICD-10-CM | POA: Diagnosis not present

## 2015-04-21 DIAGNOSIS — D500 Iron deficiency anemia secondary to blood loss (chronic): Secondary | ICD-10-CM

## 2015-04-21 DIAGNOSIS — E119 Type 2 diabetes mellitus without complications: Secondary | ICD-10-CM

## 2015-04-21 DIAGNOSIS — M150 Primary generalized (osteo)arthritis: Secondary | ICD-10-CM

## 2015-04-21 DIAGNOSIS — M159 Polyosteoarthritis, unspecified: Secondary | ICD-10-CM

## 2015-04-21 LAB — GLUCOSE, POCT (MANUAL RESULT ENTRY): POC GLUCOSE: 190 mg/dL — AB (ref 70–99)

## 2015-04-21 MED ORDER — METFORMIN HCL ER 500 MG PO TB24: 1000.0000 mg | ORAL_TABLET | Freq: Every day | ORAL | Status: AC

## 2015-04-21 MED ORDER — PANTOPRAZOLE SODIUM 40 MG PO TBEC: 40.0000 mg | DELAYED_RELEASE_TABLET | Freq: Every day | ORAL | Status: AC

## 2015-04-21 MED ORDER — GLUCOSE BLOOD VI STRP: ORAL_STRIP | Status: AC

## 2015-04-21 MED ORDER — ONETOUCH LANCETS MISC: Status: AC

## 2015-04-21 NOTE — Addendum Note (Signed)
Addended by: Marian Sorrow on: 04/21/2015 10:56 AM   Modules accepted: Orders

## 2015-04-21 NOTE — Progress Notes (Signed)
Subjective:    Patient ID: Julian Mcdonald, male    DOB: 08-14-1941, 74 y.o.   MRN: [id number]  HPI  Lab Results  Component Value Date   HGBA1C 8.3* 04/18/2015    74 year old patient who has a history of impaired glucose tolerance with a recent diagnosis of diabetes.  He is seen here today to initiate the diabetic management.  His wife has diabetes. Fasting blood sugar today 190  He has been followed for iron deficiency anemia.  He has been on Relafen due to chronic arthritis which has been discontinued.  He has had some epigastric pain.  He does have a history colonic polyps and underwent colonoscopy in October of last year.  He generally feels better today.  He is scheduled for GI follow-up later today No iron therapy or PPI therapy  He was treated very briefly for possible PMR due to weakness, anemia, anorexia, weight loss and malaise as well as elevated sedimentation rate.  He did not improve on a short course of prednisone and this has been discontinued  Patient has been unable to return stool for occult blood due to chronic constipation.  Ferritin level low, consistent with iron deficiency anemia  Wt Readings from Last 3 Encounters:  04/21/15 248 lb (112.492 kg)  04/18/15 247 lb (112.038 kg)  03/24/15 254 lb (115.214 kg)    Past Medical History  Diagnosis Date  . COLONIC POLYPS, HX OF 06/19/2010  . HYPERLIPIDEMIA 06/19/2010  . HYPERTENSION 06/19/2010  . LOW BACK PAIN 06/19/2010  . OSTEOARTHRITIS 06/19/2010  . Morbid obesity   . ASTHMA     x1 event  . GERD (gastroesophageal reflux disease)     occasional  . Headache(784.0)     hx of, none in 10 years    History   Social History  . Marital Status: Married    Spouse Name: N/A  . Number of Children: N/A  . Years of Education: N/A   Occupational History  . Not on file.   Social History Main Topics  . Smoking status: Former Smoker -- 40 years    Types: Cigarettes    Quit date: 10/01/1996  . Smokeless tobacco:  Never Used  . Alcohol Use: Yes     Comment: social  . Drug Use: No  . Sexual Activity: Not on file   Other Topics Concern  . Not on file   Social History Narrative    Past Surgical History  Procedure Laterality Date  . Total knee arthroplasty    . Lumbar laminectomy  2011  . Colonoscopy      next in August  . Tonsillectomy  1948  . Joint replacement Bilateral 2010, 2012    knees  . Trigeminal nerve decompression  1976    "trigeminal neuralgia"  . Total hip arthroplasty Left 03/23/2014    Procedure: LEFT TOTAL HIP ARTHROPLASTY ANTERIOR APPROACH;  Surgeon: Mauri Pole, MD;  Location: [facility];  Service: Orthopedics;  Laterality: Left;    No family history on file.  Allergies  Allergen Reactions  . Morphine Hives    Rash and "goes crazy"    Current Outpatient Prescriptions on File Prior to Visit  Medication Sig Dispense Refill  . diltiazem (CARDIZEM) 120 MG tablet TAKE 2 TABLETS EVERY MORNING  AND TAKE 1 TABLET AT BEDTIME 270 tablet 1  . triamcinolone cream (KENALOG) 0.1 % Apply 1 application topically 2 (two) times daily. 60 g 2  . lisinopril (PRINIVIL,ZESTRIL) 40 MG tablet TAKE 1 TABLET  EVERY DAY (Patient not taking: Reported on 03/24/2015) 90 tablet 1  . nabumetone (RELAFEN) 750 MG tablet TAKE 1 TABLET TWICE DAILY (Patient not taking: Reported on 03/24/2015) 180 tablet 1  . polyethylene glycol (MIRALAX / GLYCOLAX) packet Take 17 g by mouth 2 (two) times daily. (Patient not taking: Reported on 04/18/2015) 14 each 0  . simvastatin (ZOCOR) 40 MG tablet TAKE 1 TABLET EVERY DAY (Patient not taking: Reported on 03/24/2015) 90 tablet 1   No current facility-administered medications on file prior to visit.    BP 138/70 mmHg  Pulse 84  Temp(Src) 98 F (36.7 C) (Oral)  Resp 20  Ht 5' 8.5" (1.74 m)  Wt 248 lb (112.492 kg)  BMI 37.16 kg/m2  SpO2 97%     Review of Systems  Constitutional: Positive for fatigue. Negative for fever, chills and appetite change.  HENT:  Negative for congestion, dental problem, ear pain, hearing loss, sore throat, tinnitus, trouble swallowing and voice change.   Eyes: Negative for pain, discharge and visual disturbance.  Respiratory: Negative for cough, chest tightness, wheezing and stridor.   Cardiovascular: Negative for chest pain, palpitations and leg swelling.  Gastrointestinal: Positive for abdominal pain. Negative for nausea, vomiting, diarrhea, constipation, blood in stool and abdominal distention.  Genitourinary: Negative for urgency, hematuria, flank pain, discharge, difficulty urinating and genital sores.  Musculoskeletal: Negative for myalgias, back pain, joint swelling, arthralgias, gait problem and neck stiffness.  Skin: Negative for rash.  Neurological: Positive for weakness. Negative for dizziness, syncope, speech difficulty, numbness and headaches.  Hematological: Negative for adenopathy. Does not bruise/bleed easily.  Psychiatric/Behavioral: Negative for behavioral problems and dysphoric mood. The patient is not nervous/anxious.        Objective:   Physical Exam  Constitutional: He is oriented to person, place, and time. He appears well-developed and well-nourished. No distress.  Obese No distress Blood pressure normal  HENT:  Head: Normocephalic.  Right Ear: External ear normal.  Left Ear: External ear normal.  Eyes: Conjunctivae and EOM are normal.  Neck: Normal range of motion.  Cardiovascular: Normal rate and normal heart sounds.   Pulmonary/Chest: Breath sounds normal.  Abdominal: Bowel sounds are normal. There is tenderness.  Epigastric tenderness  Musculoskeletal: Normal range of motion. He exhibits no edema or tenderness.  Neurological: He is alert and oriented to person, place, and time.  Psychiatric: He has a normal mood and affect. His behavior is normal.          Assessment & Plan:   Diabetes mellitus.  Nonpharmacologic management discussed at length including proper diet, weight  loss and exercise.  He was placed on metformin therapy 500 mg twice a day.  Home blood sugar monitoring will be encouraged.  His wife does have diabetes as well Osteoarthritis.  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  Will hold iron therapy at this time if upper endoscopy is planned.  Will start PPI therapy.  GI follow-up as scheduled  Return in one month for follow-up

## 2015-04-21 NOTE — Progress Notes (Signed)
Pre visit review using our clinic review tool, if applicable. No additional management support is needed unless otherwise documented below in the visit note. 

## 2015-04-21 NOTE — Patient Instructions (Signed)
Okay to resume simvastatin  Ask Dr. Collene Mares about iron therapy  Return in one month for follow-up  Diabetes Mellitus and Food It is important for you to manage your blood sugar (glucose) level. Your blood glucose level can be greatly affected by what you eat. Eating healthier foods in the appropriate amounts throughout the day at about the same time each day will help you control your blood glucose level. It can also help slow or prevent worsening of your diabetes mellitus. Healthy eating may even help you improve the level of your blood pressure and reach or maintain a healthy weight.  HOW CAN FOOD AFFECT ME? Carbohydrates Carbohydrates affect your blood glucose level more than any other type of food. Your dietitian will help you determine how many carbohydrates to eat at each meal and teach you how to count carbohydrates. Counting carbohydrates is important to keep your blood glucose at a healthy level, especially if you are using insulin or taking certain medicines for diabetes mellitus. Alcohol Alcohol can cause sudden decreases in blood glucose (hypoglycemia), especially if you use insulin or take certain medicines for diabetes mellitus. Hypoglycemia can be a life-threatening condition. Symptoms of hypoglycemia (sleepiness, dizziness, and disorientation) are similar to symptoms of having too much alcohol.  If your health care provider has given you approval to drink alcohol, do so in moderation and use the following guidelines:  Women should not have more than one drink per day, and men should not have more than two drinks per day. One drink is equal to:  12 oz of beer.  5 oz of wine.  1 oz of hard liquor.  Do not drink on an empty stomach.  Keep yourself hydrated. Have water, diet soda, or unsweetened iced tea.  Regular soda, juice, and other mixers might contain a lot of carbohydrates and should be counted. WHAT FOODS ARE NOT RECOMMENDED? As you make food choices, it is important to  remember that all foods are not the same. Some foods have fewer nutrients per serving than other foods, even though they might have the same number of calories or carbohydrates. It is difficult to get your body what it needs when you eat foods with fewer nutrients. Examples of foods that you should avoid that are high in calories and carbohydrates but low in nutrients include:  Trans fats (most processed foods list trans fats on the Nutrition Facts label).  Regular soda.  Juice.  Candy.  Sweets, such as cake, pie, doughnuts, and cookies.  Fried foods. WHAT FOODS CAN I EAT? Have nutrient-rich foods, which will nourish your body and keep you healthy. The food you should eat also will depend on several factors, including:  The calories you need.  The medicines you take.  Your weight.  Your blood glucose level.  Your blood pressure level.  Your cholesterol level. You also should eat a variety of foods, including:  Protein, such as meat, poultry, fish, tofu, nuts, and seeds (lean animal proteins are best).  Fruits.  Vegetables.  Dairy products, such as milk, cheese, and yogurt (low fat is best).  Breads, grains, pasta, cereal, rice, and beans.  Fats such as olive oil, trans fat-free margarine, canola oil, avocado, and olives. DOES EVERYONE WITH DIABETES MELLITUS HAVE THE SAME MEAL PLAN? Because every person with diabetes mellitus is different, there is not one meal plan that works for everyone. It is very important that you meet with a dietitian who will help you create a meal plan that is just  right for you. Document Released: 06/14/2005 Document Revised: 09/22/2013 Document Reviewed: 08/14/2013 [facility] Patient Information 2015 [place], [place]. This information is not intended to replace advice given to you by your health care provider. Make sure you discuss any questions you have with your health care provider. Diabetes, Eating Away From Home Sometimes, you might eat in a  restaurant or have meals that are prepared by someone else. You can enjoy eating out. However, the portions in restaurants may be much larger than needed. Listed below are some ideas to help you choose foods that will keep your blood glucose (sugar) in better control.  TIPS FOR EATING OUT  Know your meal plan and how many carbohydrate servings you should have at each meal. You may wish to carry a copy of your meal plan in your purse or wallet. Learn the foods included in each food group.  Make a list of restaurants near you that offer healthy choices. Take a copy of the carry-out menus to see what they offer. Then, you can plan what you will order ahead of time.  Become familiar with serving sizes by practicing them at home using measuring cups and spoons. Once you learn to recognize portion sizes, you will be able to correctly estimate the amount of total carbohydrate you are allowed to eat at the restaurant. Ask for a takeout box if the portion is more than you should have. When your food comes, leave the amount you should have on the plate, and put the rest in the takeout box before you start eating.  Plan ahead if your mealtime will be different from usual. Check with your caregiver to find out how to time meals and medicine if you are taking insulin.  Avoid high-fat foods, such as fried foods, cream sauces, high-fat salad dressings, or any added butter or margarine.  Do not be afraid to ask questions. Ask your server about the portion size, cooking methods, ingredients and if items can be substituted. Restaurants do not list all available items on the menu. You can ask for your main entree to be prepared using skim milk, oil instead of butter or margarine, and without gravy or sauces. Ask your waiter or waitress to serve salad dressings, gravy, sauces, margarine, and sour cream on the side. You can then add the amount your meal plan suggests.  Add more vegetables whenever possible.  Avoid items  that are labeled "jumbo," "giant," "deluxe," or "supersized."  You may want to split an entre with someone and order an extra side salad.  Watch for hidden calories in foods like croutons, bacon, or cheese.  Ask your server to take away the bread basket or chips from your table.  Order a dinner salad as an appetizer. You can eat most foods served in a restaurant. Some foods are better choices than others. Breads and Starches  Recommended: All kinds of bread (wheat, rye, white, oatmeal, [place], [place], raisin), hard or soft dinner rolls, frankfurter or hamburger buns, small bagels, small corn or whole-wheat flour tortillas.  Avoid: Frosted or glazed breads, butter rolls, egg or cheese breads, croissants, sweet rolls, pastries, coffee cake, glazed or frosted doughnuts, muffins. Crackers  Recommended: Animal crackers, graham, rye, saltine, oyster, and matzoth crackers. Bread sticks, melba toast, rusks, pretzels, popcorn (without fat), zwieback toast.  Avoid: High-fat snack crackers or chips. Buttered popcorn. Cereals  Recommended: Hot and cold cereals. Whole grains such as oatmeal or shredded wheat are good choices.  Avoid: Sugar-coated or granola type cereals. Potatoes/Pasta/Rice/Beans  Recommended: Order baked, boiled, or mashed potatoes, rice or noodles without added fat, whole beans. Order gravies, butter, margarine, or sauces on the side so you can control the amount you add.  Avoid: Hash browns or fried potatoes. Potatoes, pasta, or rice prepared with cream or cheese sauce. Potato or pasta salads prepared with large amounts of dressing. Fried beans or fried rice. Vegetables  Recommended: Order steamed, baked, boiled, or stewed vegetables without sauces or extra fat. Ask that sauce be served on the side. If vegetables are not listed on the menu, ask what is available.  Avoid: Vegetables prepared with cream, butter, or cheese sauce. Fried vegetables. Salad Bars  Recommended:  Many of the vegetables at a salad bar are considered "free." Use lemon juice, vinegar, or low-calorie salad dressing (fewer than 20 calories per serving) as "free" dressings for your salad. Look for salad bar ingredients that have no added fat or sugar such as tomatoes, lettuce, cucumbers, broccoli, carrots, onions, and mushrooms.  Avoid: Prepared salads with large amounts of dressing, such as coleslaw, caesar salad, macaroni salad, bean salad, or carrot salad. Fruit  Recommended: Eat fresh fruit or fresh fruit salad without added dressing. A salad bar often offers fresh fruit choices, but canned fruit at a restaurant is usually packed in sugar or syrup.  Avoid: Sweetened canned or frozen fruits, plain or sweetened fruit juice. Fruit salads with dressing, sour cream, or sugar added to them. Meat and Meat Substitutes  Recommended: Order broiled, baked, roasted, or grilled meat, poultry, or fish. Trim off all visible fat. Do not eat the skin of poultry. The size stated on the menu is the raw weight. Meat shrinks by  in cooking (for example, 4 oz raw equals 3 oz cooked meat).  Avoid: Deep-fat fried meat, poultry, or fish. Breaded meats. Eggs  Recommended: Order soft, hard-cooked, poached, or scrambled eggs. Omelets may be okay, depending on what ingredients are added. Egg substitutes are also a good choice.  Avoid: Fried eggs, eggs prepared with cream or cheese sauce. Milk  Recommended: Order low-fat or fat-free milk according to your meal plan. Plain, nonfat yogurt or flavored yogurt with no sugar added may be used as a substitute for milk. Soy milk may also be used.  Avoid: Milk shakes or sweetened milk beverages. Soups and Combination Foods  Recommended: Clear broth or consomm are "free" foods and may be used as an appetizer. Broth-based soups with fat removed count as a starch serving and are preferred over cream soups. Soups made with beans or split peas may be eaten but count as a  starch.  Avoid: Fatty soups, soup made with cream, cheese soup. Combination foods prepared with excessive amounts of fat or with cream or cheese sauces. Desserts and Sweets  Recommended: Ask for fresh fruit. Sponge or angel food cake without icing, ice milk, no sugar added ice cream, sherbet, or frozen yogurt may fit into your meal plan occasionally.  Avoid: Pastries, puddings, pies, cakes with icing, custard, gelatin desserts. Fats and Oils  Recommended: Choose healthy fats such as olive oil, canola oil, or tub margarine, reduced fat or fat-free sour cream, cream cheese, avocado, or nuts.  Avoid: Any fats in excess of your allowed portion. Deep-fried foods or any food with a large amount of fat. Note: Ask for all fats to be served on the side, and limit your portion sizes according to your meal plan. Document Released: 09/17/2005 Document Revised: 12/10/2011 Document Reviewed: 12/15/2013 [facility] Patient Information 2015 [place], [place].  This information is not intended to replace advice given to you by your health care provider. Make sure you discuss any questions you have with your health care provider. Type 2 Diabetes Mellitus Type 2 diabetes mellitus, often simply referred to as type 2 diabetes, is a long-lasting (chronic) disease. In type 2 diabetes, the pancreas does not make enough insulin (a hormone), the cells are less responsive to the insulin that is made (insulin resistance), or both. Normally, insulin moves sugars from food into the tissue cells. The tissue cells use the sugars for energy. The lack of insulin or the lack of normal response to insulin causes excess sugars to build up in the blood instead of going into the tissue cells. As a result, high blood sugar (hyperglycemia) develops. The effect of high sugar (glucose) levels can cause many complications. Type 2 diabetes was also previously called adult-onset diabetes, but it can occur at any age.  RISK FACTORS  A person is  predisposed to developing type 2 diabetes if someone in the family has the disease and also has one or more of the following primary risk factors:  Overweight.  An inactive lifestyle.  A history of consistently eating high-calorie foods. Maintaining a normal weight and regular physical activity can reduce the chance of developing type 2 diabetes. SYMPTOMS  A person with type 2 diabetes may not show symptoms initially. The symptoms of type 2 diabetes appear slowly. The symptoms include:  Increased thirst (polydipsia).  Increased urination (polyuria).  Increased urination during the night (nocturia).  Weight loss. This weight loss may be rapid.  Frequent, recurring infections.  Tiredness (fatigue).  Weakness.  Vision changes, such as blurred vision.  Fruity smell to your breath.  Abdominal pain.  Nausea or vomiting.  Cuts or bruises which are slow to heal.  Tingling or numbness in the hands or feet. DIAGNOSIS Type 2 diabetes is frequently not diagnosed until complications of diabetes are present. Type 2 diabetes is diagnosed when symptoms or complications are present and when blood glucose levels are increased. Your blood glucose level may be checked by one or more of the following blood tests:  A fasting blood glucose test. You will not be allowed to eat for at least 8 hours before a blood sample is taken.  A random blood glucose test. Your blood glucose is checked at any time of the day regardless of when you ate.  A hemoglobin A1c blood glucose test. A hemoglobin A1c test provides information about blood glucose control over the previous 3 months.  An oral glucose tolerance test (OGTT). Your blood glucose is measured after you have not eaten (fasted) for 2 hours and then after you drink a glucose-containing beverage. TREATMENT   You may need to take insulin or diabetes medicine daily to keep blood glucose levels in the desired range.  If you use insulin, you may  need to adjust the dosage depending on the carbohydrates that you eat with each meal or snack. The treatment goal is to maintain the before meal blood sugar (preprandial glucose) level at 70-130 mg/dL. HOME CARE INSTRUCTIONS   Have your hemoglobin A1c level checked twice a year.  Perform daily blood glucose monitoring as directed by your health care provider.  Monitor urine ketones when you are ill and as directed by your health care provider.  Take your diabetes medicine or insulin as directed by your health care provider to maintain your blood glucose levels in the desired range.  Never run out of  diabetes medicine or insulin. It is needed every day.  If you are using insulin, you may need to adjust the amount of insulin given based on your intake of carbohydrates. Carbohydrates can raise blood glucose levels but need to be included in your diet. Carbohydrates provide vitamins, minerals, and fiber which are an essential part of a healthy diet. Carbohydrates are found in fruits, vegetables, whole grains, dairy products, legumes, and foods containing added sugars.  Eat healthy foods. You should make an appointment to see a registered dietitian to help you create an eating plan that is right for you.  Lose weight if you are overweight.  Carry a medical alert card or wear your medical alert jewelry.  Carry a 15-gram carbohydrate snack with you at all times to treat low blood glucose (hypoglycemia). Some examples of 15-gram carbohydrate snacks include:  Glucose tablets, 3 or 4.  Glucose gel, 15-gram tube.  Raisins, 2 tablespoons (24 grams).  Jelly beans, 6.  Animal crackers, 8.  Regular pop, 4 ounces (120 mL).  Gummy treats, 9.  Recognize hypoglycemia. Hypoglycemia occurs with blood glucose levels of 70 mg/dL and below. The risk for hypoglycemia increases when fasting or skipping meals, during or after intense exercise, and during sleep. Hypoglycemia symptoms can  include:  Tremors or shakes.  Decreased ability to concentrate.  Sweating.  Increased heart rate.  Headache.  Dry mouth.  Hunger.  Irritability.  Anxiety.  Restless sleep.  Altered speech or coordination.  Confusion.  Treat hypoglycemia promptly. If you are alert and able to safely swallow, follow the 15:15 rule:  Take 15-20 grams of rapid-acting glucose or carbohydrate. Rapid-acting options include glucose gel, glucose tablets, or 4 ounces (120 mL) of fruit juice, regular soda, or low-fat milk.  Check your blood glucose level 15 minutes after taking the glucose.  Take 15-20 grams more of glucose if the repeat blood glucose level is still 70 mg/dL or below.  Eat a meal or snack within 1 hour once blood glucose levels return to normal.  Be alert to feeling very thirsty and urinating more frequently than usual, which are early signs of hyperglycemia. An early awareness of hyperglycemia allows for prompt treatment. Treat hyperglycemia as directed by your health care provider.  Engage in at least 150 minutes of moderate-intensity physical activity a week, spread over at least 3 days of the week or as directed by your health care provider. In addition, you should engage in resistance exercise at least 2 times a week or as directed by your health care provider. Try to spend no more than 90 minutes at one time inactive.  Adjust your medicine and food intake as needed if you start a new exercise or sport.  Follow your sick-day plan anytime you are unable to eat or drink as usual.  Do not use any tobacco products including cigarettes, chewing tobacco, or electronic cigarettes. If you need help quitting, ask your health care provider.  Limit alcohol intake to no more than 1 drink per day for nonpregnant women and 2 drinks per day for men. You should drink alcohol only when you are also eating food. Talk with your health care provider whether alcohol is safe for you. Tell your  health care provider if you drink alcohol several times a week.  Keep all follow-up visits as directed by your health care provider. This is important.  Schedule an eye exam soon after the diagnosis of type 2 diabetes and then annually.  Perform daily skin and foot  care. Examine your skin and feet daily for cuts, bruises, redness, nail problems, bleeding, blisters, or sores. A foot exam by a health care provider should be done annually.  Brush your teeth and gums at least twice a day and floss at least once a day. Follow up with your dentist regularly.  Share your diabetes management plan with your workplace or school.  Stay up-to-date with immunizations. It is recommended that people with diabetes who are over 91 years old get the pneumonia vaccine. In some cases, two separate shots may be given. Ask your health care provider if your pneumonia vaccination is up-to-date.  Learn to manage stress.  Obtain ongoing diabetes education and support as needed.  Participate in or seek rehabilitation as needed to maintain or improve independence and quality of life. Request a physical or occupational therapy referral if you are having foot or hand numbness, or difficulties with grooming, dressing, eating, or physical activity. SEEK MEDICAL CARE IF:   You are unable to eat food or drink fluids for more than 6 hours.  You have nausea and vomiting for more than 6 hours.  Your blood glucose level is over 240 mg/dL.  There is a change in mental status.  You develop an additional serious illness.  You have diarrhea for more than 6 hours.  You have been sick or have had a fever for a couple of days and are not getting better.  You have pain during any physical activity.  SEEK IMMEDIATE MEDICAL CARE IF:  You have difficulty breathing.  You have moderate to large ketone levels. MAKE SURE YOU:  Understand these instructions.  Will watch your condition.  Will get help right away if you are  not doing well or get worse. Document Released: 09/17/2005 Document Revised: 02/01/2014 Document Reviewed: 04/15/2012 [facility] Patient Information 2015 [place], [place]. This information is not intended to replace advice given to you by your health care provider. Make sure you discuss any questions you have with your health care provider.

## 2015-04-28 ENCOUNTER — Encounter (HOSPITAL_COMMUNITY): Payer: Self-pay | Admitting: *Deleted

## 2015-05-03 ENCOUNTER — Ambulatory Visit (HOSPITAL_COMMUNITY): Payer: Medicare Other

## 2015-05-03 ENCOUNTER — Encounter (HOSPITAL_COMMUNITY): Payer: Self-pay

## 2015-05-03 ENCOUNTER — Encounter (HOSPITAL_COMMUNITY)
Admission: RE | Disposition: A | Discharge status: Home / Self Care | Payer: Self-pay | Source: Ambulatory Visit | Attending: Gastroenterology

## 2015-05-03 ENCOUNTER — Ambulatory Visit (HOSPITAL_COMMUNITY)
Admit: 2015-05-03 | Discharge: 2015-05-03 | Disposition: A | Discharge status: Home / Self Care | Payer: Medicare Other | Attending: Gastroenterology | Admitting: Gastroenterology

## 2015-05-03 ENCOUNTER — Ambulatory Visit (HOSPITAL_COMMUNITY)
Admission: RE | Admit: 2015-05-03 | Discharge: 2015-05-03 | Disposition: A | Discharge status: Home / Self Care | Payer: Medicare Other | Source: Ambulatory Visit | Attending: Gastroenterology | Admitting: Gastroenterology

## 2015-05-03 ENCOUNTER — Ambulatory Visit (HOSPITAL_COMMUNITY): Payer: Medicare Other | Admitting: Certified Registered Nurse Anesthetist

## 2015-05-03 DIAGNOSIS — K219 Gastro-esophageal reflux disease without esophagitis: Secondary | ICD-10-CM | POA: Insufficient documentation

## 2015-05-03 DIAGNOSIS — Z87891 Personal history of nicotine dependence: Secondary | ICD-10-CM | POA: Diagnosis not present

## 2015-05-03 DIAGNOSIS — C155 Malignant neoplasm of lower third of esophagus: Secondary | ICD-10-CM | POA: Insufficient documentation

## 2015-05-03 DIAGNOSIS — Z6837 Body mass index (BMI) 37.0-37.9, adult: Secondary | ICD-10-CM | POA: Insufficient documentation

## 2015-05-03 DIAGNOSIS — Z96642 Presence of left artificial hip joint: Secondary | ICD-10-CM | POA: Insufficient documentation

## 2015-05-03 DIAGNOSIS — E119 Type 2 diabetes mellitus without complications: Secondary | ICD-10-CM | POA: Insufficient documentation

## 2015-05-03 DIAGNOSIS — I10 Essential (primary) hypertension: Secondary | ICD-10-CM | POA: Diagnosis not present

## 2015-05-03 DIAGNOSIS — K5900 Constipation, unspecified: Secondary | ICD-10-CM | POA: Diagnosis not present

## 2015-05-03 DIAGNOSIS — Z7952 Long term (current) use of systemic steroids: Secondary | ICD-10-CM | POA: Diagnosis not present

## 2015-05-03 DIAGNOSIS — Z96653 Presence of artificial knee joint, bilateral: Secondary | ICD-10-CM | POA: Diagnosis not present

## 2015-05-03 DIAGNOSIS — Z86010 Personal history of colonic polyps: Secondary | ICD-10-CM | POA: Diagnosis not present

## 2015-05-03 DIAGNOSIS — C159 Malignant neoplasm of esophagus, unspecified: Secondary | ICD-10-CM

## 2015-05-03 DIAGNOSIS — D509 Iron deficiency anemia, unspecified: Secondary | ICD-10-CM | POA: Insufficient documentation

## 2015-05-03 DIAGNOSIS — Z79899 Other long term (current) drug therapy: Secondary | ICD-10-CM | POA: Insufficient documentation

## 2015-05-03 DIAGNOSIS — K2289 Other specified disease of esophagus: Secondary | ICD-10-CM

## 2015-05-03 DIAGNOSIS — J45909 Unspecified asthma, uncomplicated: Secondary | ICD-10-CM | POA: Diagnosis not present

## 2015-05-03 DIAGNOSIS — M1990 Unspecified osteoarthritis, unspecified site: Secondary | ICD-10-CM | POA: Diagnosis not present

## 2015-05-03 DIAGNOSIS — R1310 Dysphagia, unspecified: Secondary | ICD-10-CM | POA: Diagnosis present

## 2015-05-03 DIAGNOSIS — E785 Hyperlipidemia, unspecified: Secondary | ICD-10-CM | POA: Insufficient documentation

## 2015-05-03 DIAGNOSIS — K228 Other specified diseases of esophagus: Secondary | ICD-10-CM

## 2015-05-03 HISTORY — PX: ESOPHAGOGASTRODUODENOSCOPY (EGD) WITH PROPOFOL: SHX5813

## 2015-05-03 HISTORY — DX: Type 2 diabetes mellitus without complications: E11.9

## 2015-05-03 LAB — GLUCOSE, CAPILLARY: Glucose-Capillary: 119 mg/dL — ABNORMAL HIGH (ref 65–99)

## 2015-05-03 SURGERY — ESOPHAGOGASTRODUODENOSCOPY (EGD) WITH PROPOFOL
Anesthesia: Monitor Anesthesia Care

## 2015-05-03 MED ORDER — PROPOFOL INFUSION 10 MG/ML OPTIME
INTRAVENOUS | Status: DC | PRN
  Administered 2015-05-03: 160 ug/kg/min via INTRAVENOUS

## 2015-05-03 MED ORDER — ONDANSETRON HCL 4 MG/2ML IJ SOLN
INTRAMUSCULAR | Status: DC | PRN
  Administered 2015-05-03: 4 mg via INTRAVENOUS

## 2015-05-03 MED ORDER — PROPOFOL 10 MG/ML IV BOLUS
INTRAVENOUS | Status: DC | PRN
  Administered 2015-05-03: 20 mg via INTRAVENOUS

## 2015-05-03 MED ORDER — IOHEXOL 300 MG/ML  SOLN: 25.0000 mL | INTRAMUSCULAR | Status: AC

## 2015-05-03 MED ORDER — SODIUM CHLORIDE 0.9 % IV SOLN: INTRAVENOUS | Status: DC

## 2015-05-03 MED ORDER — LIDOCAINE HCL (CARDIAC) 20 MG/ML IV SOLN
INTRAVENOUS | Status: AC
  Filled 2015-05-03: qty 5

## 2015-05-03 MED ORDER — ONDANSETRON HCL 4 MG/2ML IJ SOLN
INTRAMUSCULAR | Status: AC
  Filled 2015-05-03: qty 2

## 2015-05-03 MED ORDER — PROPOFOL 10 MG/ML IV BOLUS
INTRAVENOUS | Status: AC
  Filled 2015-05-03: qty 20

## 2015-05-03 MED ORDER — LIDOCAINE HCL (CARDIAC) 20 MG/ML IV SOLN
INTRAVENOUS | Status: DC | PRN
  Administered 2015-05-03: 100 mg via INTRAVENOUS

## 2015-05-03 MED ORDER — IOHEXOL 300 MG/ML  SOLN: 25.0000 mL | INTRAMUSCULAR | Status: DC

## 2015-05-03 MED ORDER — IOHEXOL 300 MG/ML  SOLN
100.0000 mL | Freq: Once | INTRAMUSCULAR | Status: AC | PRN
  Administered 2015-05-03: 100 mL via INTRAVENOUS

## 2015-05-03 MED ORDER — LACTATED RINGERS IV SOLN
INTRAVENOUS | Status: DC
  Administered 2015-05-03: 13:00:00 via INTRAVENOUS

## 2015-05-03 MED ORDER — IOHEXOL 300 MG/ML  SOLN
25.0000 mL | INTRAMUSCULAR | Status: AC
  Administered 2015-05-03: 25 mL via ORAL

## 2015-05-03 SURGICAL SUPPLY — 14 items

## 2015-05-03 NOTE — Transfer of Care (Signed)
Immediate Anesthesia Transfer of Care Note  Patient: Julian Mcdonald  Procedure(s) Performed: Procedure(s): ESOPHAGOGASTRODUODENOSCOPY (EGD) WITH PROPOFOL (N/A)  Patient Location: ENDO  Anesthesia Type:MAC  Level of Consciousness:  sedated, patient cooperative and responds to stimulation  Airway & Oxygen Therapy:Patient Spontanous Breathing and Patient connected to face mask oxgen  Post-op Assessment:  Report given to ENDO RN and Post -op Vital signs reviewed and stable  Post vital signs:  Reviewed and stable  Last Vitals:  Filed Vitals:   05/03/15 1247  BP: 164/77  Pulse: 83  Temp: 36.6 C  Resp: 23    Complications: No apparent anesthesia complications

## 2015-05-03 NOTE — Anesthesia Postprocedure Evaluation (Signed)
  Anesthesia Post-op Note  Patient: Julian Mcdonald  Procedure(s) Performed: Procedure(s) (LRB): ESOPHAGOGASTRODUODENOSCOPY (EGD) WITH PROPOFOL (N/A)  Patient Location: PACU  Anesthesia Type: MAC  Level of Consciousness: awake and alert   Airway and Oxygen Therapy: Patient Spontanous Breathing  Post-op Pain: mild  Post-op Assessment: Post-op Vital signs reviewed, Patient's Cardiovascular Status Stable, Respiratory Function Stable, Patent Airway and No signs of Nausea or vomiting  Last Vitals:  Filed Vitals:   05/03/15 1425  BP: 143/86  Pulse:   Temp:   Resp:     Post-op Vital Signs: stable   Complications: No apparent anesthesia complications

## 2015-05-03 NOTE — Anesthesia Preprocedure Evaluation (Addendum)
Anesthesia Evaluation  Patient identified by MRN, date of birth, ID band Patient awake    Reviewed: Allergy & Precautions, NPO status , Patient's Chart, lab work & pertinent test results  Airway Mallampati: II  TM Distance: >3 FB Neck ROM: Full    Dental no notable dental hx.    Pulmonary asthma , former smoker,  breath sounds clear to auscultation  Pulmonary exam normal       Cardiovascular Exercise Tolerance: Good hypertension, Pt. on medications Normal cardiovascular examRhythm:Regular Rate:Normal     Neuro/Psych  Headaches, negative psych ROS   GI/Hepatic Neg liver ROS, GERD-  Medicated,  Endo/Other  diabetes, Type 2, Oral Hypoglycemic Agents  Renal/GU negative Renal ROS  negative genitourinary   Musculoskeletal  (+) Arthritis -,   Abdominal (+) + obese,   Peds negative pediatric ROS (+)  Hematology  (+) anemia ,   Anesthesia Other Findings   Reproductive/Obstetrics negative OB ROS                             Anesthesia Physical Anesthesia Plan  ASA: III  Anesthesia Plan: MAC   Post-op Pain Management:    Induction: Intravenous  Airway Management Planned: Natural Airway  Additional Equipment:   Intra-op Plan:   Post-operative Plan:   Informed Consent: I have reviewed the patients History and Physical, chart, labs and discussed the procedure including the risks, benefits and alternatives for the proposed anesthesia with the patient or authorized representative who has indicated his/her understanding and acceptance.   Dental advisory given  Plan Discussed with: CRNA  Anesthesia Plan Comments:         Anesthesia Quick Evaluation

## 2015-05-03 NOTE — Interval H&P Note (Signed)
History and Physical Interval Note:  05/03/2015 12:08 PM  Julian Mcdonald  has presented today for surgery, with the diagnosis of dysphagia  The various methods of treatment have been discussed with the patient and family. After consideration of risks, benefits and other options for treatment, the patient has consented to  Procedure(s): ESOPHAGOGASTRODUODENOSCOPY (EGD) WITH PROPOFOL (N/A) as a surgical intervention .  The patient's history has been reviewed, patient examined, no change in status, stable for surgery.  I have reviewed the patient's chart and labs.  Questions were answered to the patient's satisfaction.     Kenly Xiao D

## 2015-05-03 NOTE — H&P (View-Only) (Signed)
Subjective:    Patient ID: Julian Mcdonald, male    DOB: Nov 09, 1940, 74 y.o.   MRN: [id number]  HPI  Lab Results  Component Value Date   HGBA1C 8.3* 04/18/2015    74 year old patient who has a history of impaired glucose tolerance with a recent diagnosis of diabetes.  He is seen here today to initiate the diabetic management.  His wife has diabetes. Fasting blood sugar today 190  He has been followed for iron deficiency anemia.  He has been on Relafen due to chronic arthritis which has been discontinued.  He has had some epigastric pain.  He does have a history colonic polyps and underwent colonoscopy in October of last year.  He generally feels better today.  He is scheduled for GI follow-up later today No iron therapy or PPI therapy  He was treated very briefly for possible PMR due to weakness, anemia, anorexia, weight loss and malaise as well as elevated sedimentation rate.  He did not improve on a short course of prednisone and this has been discontinued  Patient has been unable to return stool for occult blood due to chronic constipation.  Ferritin level low, consistent with iron deficiency anemia  Wt Readings from Last 3 Encounters:  04/21/15 248 lb (112.492 kg)  04/18/15 247 lb (112.038 kg)  03/24/15 254 lb (115.214 kg)    Past Medical History  Diagnosis Date  . COLONIC POLYPS, HX OF 06/19/2010  . HYPERLIPIDEMIA 06/19/2010  . HYPERTENSION 06/19/2010  . LOW BACK PAIN 06/19/2010  . OSTEOARTHRITIS 06/19/2010  . Morbid obesity   . ASTHMA     x1 event  . GERD (gastroesophageal reflux disease)     occasional  . Headache(784.0)     hx of, none in 10 years    History   Social History  . Marital Status: Married    Spouse Name: N/A  . Number of Children: N/A  . Years of Education: N/A   Occupational History  . Not on file.   Social History Main Topics  . Smoking status: Former Smoker -- 40 years    Types: Cigarettes    Quit date: 10/01/1996  . Smokeless tobacco:  Never Used  . Alcohol Use: Yes     Comment: social  . Drug Use: No  . Sexual Activity: Not on file   Other Topics Concern  . Not on file   Social History Narrative    Past Surgical History  Procedure Laterality Date  . Total knee arthroplasty    . Lumbar laminectomy  2011  . Colonoscopy      next in August  . Tonsillectomy  1948  . Joint replacement Bilateral 2010, 2012    knees  . Trigeminal nerve decompression  1976    "trigeminal neuralgia"  . Total hip arthroplasty Left 03/23/2014    Procedure: LEFT TOTAL HIP ARTHROPLASTY ANTERIOR APPROACH;  Surgeon: Mauri Pole, MD;  Location: [facility];  Service: Orthopedics;  Laterality: Left;    No family history on file.  Allergies  Allergen Reactions  . Morphine Hives    Rash and "goes crazy"    Current Outpatient Prescriptions on File Prior to Visit  Medication Sig Dispense Refill  . diltiazem (CARDIZEM) 120 MG tablet TAKE 2 TABLETS EVERY MORNING  AND TAKE 1 TABLET AT BEDTIME 270 tablet 1  . triamcinolone cream (KENALOG) 0.1 % Apply 1 application topically 2 (two) times daily. 60 g 2  . lisinopril (PRINIVIL,ZESTRIL) 40 MG tablet TAKE 1 TABLET  EVERY DAY (Patient not taking: Reported on 03/24/2015) 90 tablet 1  . nabumetone (RELAFEN) 750 MG tablet TAKE 1 TABLET TWICE DAILY (Patient not taking: Reported on 03/24/2015) 180 tablet 1  . polyethylene glycol (MIRALAX / GLYCOLAX) packet Take 17 g by mouth 2 (two) times daily. (Patient not taking: Reported on 04/18/2015) 14 each 0  . simvastatin (ZOCOR) 40 MG tablet TAKE 1 TABLET EVERY DAY (Patient not taking: Reported on 03/24/2015) 90 tablet 1   No current facility-administered medications on file prior to visit.    BP 138/70 mmHg  Pulse 84  Temp(Src) 98 F (36.7 C) (Oral)  Resp 20  Ht 5' 8.5" (1.74 m)  Wt 248 lb (112.492 kg)  BMI 37.16 kg/m2  SpO2 97%     Review of Systems  Constitutional: Positive for fatigue. Negative for fever, chills and appetite change.  HENT:  Negative for congestion, dental problem, ear pain, hearing loss, sore throat, tinnitus, trouble swallowing and voice change.   Eyes: Negative for pain, discharge and visual disturbance.  Respiratory: Negative for cough, chest tightness, wheezing and stridor.   Cardiovascular: Negative for chest pain, palpitations and leg swelling.  Gastrointestinal: Positive for abdominal pain. Negative for nausea, vomiting, diarrhea, constipation, blood in stool and abdominal distention.  Genitourinary: Negative for urgency, hematuria, flank pain, discharge, difficulty urinating and genital sores.  Musculoskeletal: Negative for myalgias, back pain, joint swelling, arthralgias, gait problem and neck stiffness.  Skin: Negative for rash.  Neurological: Positive for weakness. Negative for dizziness, syncope, speech difficulty, numbness and headaches.  Hematological: Negative for adenopathy. Does not bruise/bleed easily.  Psychiatric/Behavioral: Negative for behavioral problems and dysphoric mood. The patient is not nervous/anxious.        Objective:   Physical Exam  Constitutional: He is oriented to person, place, and time. He appears well-developed and well-nourished. No distress.  Obese No distress Blood pressure normal  HENT:  Head: Normocephalic.  Right Ear: External ear normal.  Left Ear: External ear normal.  Eyes: Conjunctivae and EOM are normal.  Neck: Normal range of motion.  Cardiovascular: Normal rate and normal heart sounds.   Pulmonary/Chest: Breath sounds normal.  Abdominal: Bowel sounds are normal. There is tenderness.  Epigastric tenderness  Musculoskeletal: Normal range of motion. He exhibits no edema or tenderness.  Neurological: He is alert and oriented to person, place, and time.  Psychiatric: He has a normal mood and affect. His behavior is normal.          Assessment & Plan:   Diabetes mellitus.  Nonpharmacologic management discussed at length including proper diet, weight  loss and exercise.  He was placed on metformin therapy 500 mg twice a day.  Home blood sugar monitoring will be encouraged.  His wife does have diabetes as well Osteoarthritis.  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  Will hold iron therapy at this time if upper endoscopy is planned.  Will start PPI therapy.  GI follow-up as scheduled  Return in one month for follow-up

## 2015-05-03 NOTE — Discharge Instructions (Signed)
Esophagogastroduodenoscopy °Care After °Refer to this sheet in the next few weeks. These instructions provide you with information on caring for yourself after your procedure. Your caregiver may also give you more specific instructions. Your treatment has been planned according to current medical practices, but problems sometimes occur. Call your caregiver if you have any problems or questions after your procedure.  °HOME CARE INSTRUCTIONS °· Do not eat or drink anything until the numbing medicine (local anesthetic) has worn off and your gag reflex has returned. You will know that the local anesthetic has worn off when you can swallow comfortably. °· Do not drive for 12 hours after the procedure or as directed by your caregiver. °· Only take medicines as directed by your caregiver. °SEEK MEDICAL CARE IF:  °· You cannot stop coughing. °· You are not urinating at all or less than usual. °SEEK IMMEDIATE MEDICAL CARE IF: °· You have difficulty swallowing. °· You cannot eat or drink. °· You have worsening throat or chest pain. °· You have dizziness, lightheadedness, or you faint. °· You have nausea or vomiting. °· You have chills. °· You have a fever. °· You have severe abdominal pain. °· You have black, tarry, or bloody stools. °Document Released: 09/03/2012 Document Reviewed: 09/03/2012 °ExitCare® Patient Information ©2015 [employer]. This information is not intended to replace advice given to you by your health care provider. Make sure you discuss any questions you have with your health care provider. ° °

## 2015-05-03 NOTE — Progress Notes (Signed)
Dr. Benson Norway ordered CT of chest and abdomen today. Patient discharged from Endo area. Ct requested patient's IV be left in for use during CT scan. Care will be taken over by CT dept.

## 2015-05-04 ENCOUNTER — Encounter (HOSPITAL_COMMUNITY): Payer: Self-pay | Admitting: Gastroenterology

## 2015-05-06 ENCOUNTER — Telehealth: Payer: Self-pay | Admitting: Oncology

## 2015-05-06 NOTE — Telephone Encounter (Signed)
New patient appt-s/w patient wife and gave np appt for 08/08 @ 1:30 w/Dr. Benay Spice Referring Dr. Carol Ada  Dx-Esophageal ca

## 2015-05-09 ENCOUNTER — Encounter: Payer: Self-pay | Admitting: *Deleted

## 2015-05-09 ENCOUNTER — Encounter: Payer: Self-pay | Admitting: Oncology

## 2015-05-09 ENCOUNTER — Telehealth: Payer: Self-pay | Admitting: Oncology

## 2015-05-09 ENCOUNTER — Ambulatory Visit: Payer: Medicare Other

## 2015-05-09 ENCOUNTER — Ambulatory Visit (HOSPITAL_BASED_OUTPATIENT_CLINIC_OR_DEPARTMENT_OTHER): Payer: Medicare Other | Admitting: Oncology

## 2015-05-09 VITALS — BP 145/83 | HR 89 | Temp 97.8°F | Resp 18 | Ht 68.5 in | Wt 239.1 lb

## 2015-05-09 DIAGNOSIS — R634 Abnormal weight loss: Secondary | ICD-10-CM

## 2015-05-09 DIAGNOSIS — R0600 Dyspnea, unspecified: Secondary | ICD-10-CM

## 2015-05-09 DIAGNOSIS — R1310 Dysphagia, unspecified: Secondary | ICD-10-CM | POA: Diagnosis not present

## 2015-05-09 DIAGNOSIS — K869 Disease of pancreas, unspecified: Secondary | ICD-10-CM | POA: Diagnosis not present

## 2015-05-09 DIAGNOSIS — R630 Anorexia: Secondary | ICD-10-CM

## 2015-05-09 DIAGNOSIS — R109 Unspecified abdominal pain: Secondary | ICD-10-CM

## 2015-05-09 DIAGNOSIS — C159 Malignant neoplasm of esophagus, unspecified: Secondary | ICD-10-CM

## 2015-05-09 NOTE — Progress Notes (Signed)
[facility] New Patient Consult   Referring NW:[id number] Julian Mcdonald 74 y.o.  [birth year]    Reason for Referral: Esophagus Cancer   HPI: He reports a 6 month history of solid dysphagia. He has noted constipation for the past 3 months and reports no bowel movement for the past 3 weeks. He saw his primary physician and was diagnosed with iron deficiency anemia. He was referred to Dr. Collene Mcdonald and was noted to have Hemoccult-positive stool. He was taken to an upper endoscopy on 05/03/2015 by Dr. Benson Mcdonald and was noted to have a circumferential partially obstructing mass in the distal esophagus extending to 2-3 cm from the GE junction. Multiple biopsies were obtained. No abnormality in the gastric or duodenal lumen. The pathology ([id number]) confirmed invasive adenocarcinoma. He was referred for a CT CTs of the chest, abdomen, and pelvis on 05/03/2015. A 1.3 cm node was seen adjacent to the left side of the esophagus. Multiple paracardial nodes. Small bilateral pleural effusions. The liver was without lesions. An 8.4 cm mass involved the majority of the pancreas body. Diverticulum of the sigmoid colon with no evidence of bowel obstruction. Marketed wall thickening and irregularity of the distal esophagus at the GE junction. Multiple enlarged retroperitoneal nodes including a 2.5 cm left. Aortic node. Multiple enlarged porta hepatic nodes measuring up to 0.7 cm. Multiple enlarged gastrohepatic nodes. Moderate ascites. Multiple enhancing nodules at the peritoneum. Nodularity involving the omentum.    Past Medical History  Diagnosis Date  . COLONIC POLYPS, HX OF 06/19/2010  . HYPERLIPIDEMIA 06/19/2010  . HYPERTENSION 06/19/2010  . LOW BACK PAIN 06/19/2010  . OSTEOARTHRITIS 06/19/2010  . Morbid obesity   . GERD (gastroesophageal reflux disease)     occasional  . Headache(784.0)     hx of, none in 10 years  . ASTHMA     x1 event- no problems now  . Diabetes mellitus without  complication     just started Metformin- 2 weeks.    .   Esophagus cancer-adenocarcinoma , distal esophagus mass                     05/03/2015   .   Iron deficiency anemia June 2016  Past Surgical History  Procedure Laterality Date  . Total knee arthroplasty    . Lumbar laminectomy  2011  . Colonoscopy      next in August  . Tonsillectomy  1948  . Trigeminal nerve decompression  1976    "trigeminal neuralgia"  . Total hip arthroplasty Left 03/23/2014    Procedure: LEFT TOTAL HIP ARTHROPLASTY ANTERIOR APPROACH;  Surgeon: Julian Pole, MD;  Location: [facility];  Service: Orthopedics;  Laterality: Left;  . Joint replacement Bilateral 2010, 2012    knees, LTHA  . Esophagogastroduodenoscopy (egd) with propofol N/A 05/03/2015    Procedure: ESOPHAGOGASTRODUODENOSCOPY (EGD) WITH PROPOFOL;  Surgeon: Julian Ada, MD;  Location: [facility];  Service: Endoscopy;  Laterality: N/A;    Medications: Reviewed  Allergies:  Allergies  Allergen Reactions  . Morphine Hives    Rash and "goes crazy"    Family history: No family history of cancer  Social History:   He lives in [place]. 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 He does not use alcohol. No transfusion history. No risk factor for HIV or hepatitis.  History  Alcohol Use  . Yes    Comment: social    History  Smoking status  .  Former Smoker -- 23 years  . Types: Cigarettes  . Quit date: 10/01/1996  Smokeless tobacco  . Never Used      ROS:   Positives include: Anorexia, weight loss, constipation, intermittent dry heaves, dyspnea, upper abdomen pain, solid dysphagia for 6 months  A complete ROS was otherwise negative.  Physical Exam:  Blood pressure 145/83, pulse 89, temperature 97.8 F (36.6 C), temperature source Oral, resp. rate 18, height 5' 8.5" (1.74 m), weight 239 lb 1.6 oz (108.455 kg), SpO2 98 %.  HEENT: Oropharynx without visible mass, neck without mass, prominent gag  reflex limited complete evaluation of the pharynx Lungs: Clear bilaterally, no respiratory distress Cardiac: Regular rate and rhythm Abdomen: Markedly distended and tense, no mass, nontender GU: Testes without mass  Vascular: No leg edema Lymph nodes: No cervical, supraclavicular, axillary, or inguinal nodes Neurologic: Alert and oriented, the motor exam appears intact in the upper and lower extremities Skin: No rash Musculoskeletal: No spine tenderness   LAB:  CBC  Lab Results  Component Value Date   WBC 11.2* 04/18/2015   HGB 10.1* 04/18/2015   HCT 31.4* 04/18/2015   MCV 75.5* 04/18/2015   PLT 583.0* 04/18/2015   NEUTROABS 10.0* 04/18/2015     CMP      Component Value Date/Time   NA 136 03/24/2015 0849   K 4.7 03/24/2015 0849   CL 102 03/24/2015 0849   CO2 28 03/24/2015 0849   GLUCOSE 132* 03/24/2015 0849   BUN 17 03/24/2015 0849   CREATININE 1.17 03/24/2015 0849   CALCIUM 9.1 03/24/2015 0849   PROT 6.9 03/24/2015 0849   ALBUMIN 3.7 03/24/2015 0849   AST 12 03/24/2015 0849   ALT 11 03/24/2015 0849   ALKPHOS 82 03/24/2015 0849   BILITOT 0.4 03/24/2015 0849   GFRNONAA 79* 03/24/2014 0523   GFRAA >90 03/24/2014 0523    No results found for: CEA  Imaging: I reviewed the 05/03/2015 CT images with Julian Mcdonald and his family   Assessment/Plan:   1. Adenocarcinoma the esophagus, distal esophagus mass noted at endoscopy 05/03/2015  Staging CTs 05/03/2015 revealed thickening at the GE junction, a pancreas body mass, abdominal/retroperitoneal lymphadenopathy, ascites, and peritoneal nodularity   2. Iron deficiency anemia secondary to bleeding from #1  3.   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   Solid dysphagia secondary to #1  5.    Anorexia/weight loss  6.    Abdominal pain  7.    Dyspnea     Disposition:   Julian Mcdonald has been diagnosed with adenocarcinoma of the esophagus. The  clinical presentation and staging CT scans are consistent with a diagnosis of metastatic esophagus cancer. 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 It is most likely the carcinomatosis is related to esophagus cancer.  He understands no therapy will be curative. We referred him for a diagnostic/palliative therapeutic paracentesis scheduled for 05/10/2015. If he is proven to have malignant ascites I will recommend comfort care versus a trial of palliative systemic therapy.  If he is not a candidate for chemotherapy I will ask Dr. Benson Mcdonald to consider an esophagus stent.  He will try twice daily MiraLAX and Senokot for constipation. He will contact us if this does not help.  Julian Mcdonald will return for an office visit on 05/13/2015. I will present his case at the GI tumor conference on 05/18/2015. He will be scheduled to meet  with the Cancer center nutritionist. He will discontinue metformin since he has limited oral intake and a poor long-term prognosis.  Approximately 50 minutes were spent with the patient today. The majority of the time was used for counseling and coordination of care.  [place], [place] 05/09/2015, 2:39 PM

## 2015-05-09 NOTE — Telephone Encounter (Signed)
Pt confirmed labs/ov per 08/08 POF, gave pt avs and calendar.... KJ °

## 2015-05-09 NOTE — Progress Notes (Signed)
Oncology Nurse Navigator Documentation  Oncology Nurse Navigator Flowsheets 05/05/2015  Navigator Encounter Type Initial MedOnc  Patient Visit Type Medonc  Treatment Phase Abnormal Scans/pathology  Barriers/Navigation Needs Family concerns;Education  Education Understanding Cancer/ Treatment Options;Pain/ Symptom Management  Interventions Referrals;Education Method  Referrals Nutrition/dietician  Education Method Verbal;Written;Teach-back  Support Groups/Services GI  Time Spent with Patient 30  Met with patient wife, and daughter during new patient visit. Explained the role of the GI Nurse Navigator and provided New Patient Packet with information on: 1. Esophageal cancer 2. Support groups 3. Advanced Directives 4. Fall Safety Plan-reviewed why he is such a high fall risk and home safety Answered questions, reviewed current treatment plan using TEACH back and provided emotional support. Provided copy of current treatment plan. Reviewed management of constipation with MiraLax, Senna-S, fluids, activity. Explained procedure of paracentesis. Referral placed for nutrition to see him on 8/12 at follow up visit.  Merceda Elks, RN, BSN GI Oncology [facility]

## 2015-05-09 NOTE — Progress Notes (Signed)
Checked in new pt with no financial concerns prior to seeing the pt.  Pt has 2 insurances so financial assistance may not be needed but he has my card for any billing questions or concerns.

## 2015-05-09 NOTE — Telephone Encounter (Signed)
Added appointment for nutr per 2nd 8/8 pof. F/u for 8/12 previously added for KJ. No availability for nutr and f/u the same day. Wife/dtr aware. Gave wife avs report and appointments for August.

## 2015-05-10 ENCOUNTER — Ambulatory Visit (HOSPITAL_COMMUNITY)
Admission: RE | Admit: 2015-05-10 | Discharge: 2015-05-10 | Disposition: A | Discharge status: Home / Self Care | Payer: Medicare Other | Source: Ambulatory Visit | Attending: Oncology | Admitting: Oncology

## 2015-05-10 DIAGNOSIS — R188 Other ascites: Secondary | ICD-10-CM | POA: Insufficient documentation

## 2015-05-10 DIAGNOSIS — C159 Malignant neoplasm of esophagus, unspecified: Secondary | ICD-10-CM

## 2015-05-10 NOTE — Procedures (Signed)
Successful US guided paracentesis from RUQ.  Yielded 1.1 liters of serous fluid.  No immediate complications.  Pt tolerated well.   Specimen was sent for labs.  Tsosie Billing D PA-C 05/10/2015 11:09 AM

## 2015-05-11 ENCOUNTER — Telehealth: Payer: Self-pay | Admitting: *Deleted

## 2015-05-11 MED ORDER — SORBITOL 70 % PO SOLN: 15.0000 mL | Freq: Two times a day (BID) | ORAL | Status: AC | PRN

## 2015-05-11 NOTE — Telephone Encounter (Signed)
Wife left VM that Jquan still has not had BM despite the Senna-S and MiraLax. Requesting help-getting uncomfortable. Noted paracentesis drained only 1.1 liter fluid off.  Forwarded message to MD

## 2015-05-11 NOTE — Telephone Encounter (Signed)
Spoke with pt and wife re: constipation. Instructions given to drink half bottle of Citrate of Magnesium. If no results after 4 hours, use a Fleets enema, per Dr. Benay Spice. May repeat Citrate of magnesium 4 hours after enema if no results. Rx for Sorbitol sent to pharmacy. Instructions reviewed. Will titrate dose as needed. Both voiced understanding.

## 2015-05-12 ENCOUNTER — Encounter: Payer: Medicare Other | Admitting: Nutrition

## 2015-05-12 ENCOUNTER — Telehealth: Payer: Self-pay

## 2015-05-12 ENCOUNTER — Encounter: Payer: Self-pay | Admitting: Nutrition

## 2015-05-12 ENCOUNTER — Telehealth: Payer: Self-pay | Admitting: *Deleted

## 2015-05-12 NOTE — Telephone Encounter (Signed)
Returned call, spoke with pt's wife. Instructed her to have him take Sorbitol 39mL Q2-3 hours until he has a BM, per Dr. Benay Spice. When asked if pt let enema dwell before going to restroom, wife says he did not. She will have him repeat enema today, with instructions to hold it as long as he can before going to move his bowels. She will try this before repeating the Sorbitol.

## 2015-05-12 NOTE — Telephone Encounter (Signed)
Pt had to cancel his nutrition appt today b/c he was hurting too much and couldn't walk. They followed the instructions for constipation with citrate of magnesium, fleet enema and citrate of magnesium. The pharmacy filled sorbitol today. And pt took it at 2 pm. He had a BM that was so small it was not even worth talking about. Last BM was 2 weeks ago.

## 2015-05-12 NOTE — Progress Notes (Signed)
Wife called and cancelled patient's nutrition appointment today.

## 2015-05-13 ENCOUNTER — Telehealth: Payer: Self-pay | Admitting: Nurse Practitioner

## 2015-05-13 ENCOUNTER — Ambulatory Visit (HOSPITAL_BASED_OUTPATIENT_CLINIC_OR_DEPARTMENT_OTHER): Payer: Medicare Other | Admitting: Oncology

## 2015-05-13 VITALS — BP 141/88 | HR 112 | Temp 97.9°F | Resp 18 | Ht 68.5 in | Wt 234.3 lb

## 2015-05-13 DIAGNOSIS — R630 Anorexia: Secondary | ICD-10-CM

## 2015-05-13 DIAGNOSIS — D509 Iron deficiency anemia, unspecified: Secondary | ICD-10-CM

## 2015-05-13 DIAGNOSIS — R1310 Dysphagia, unspecified: Secondary | ICD-10-CM

## 2015-05-13 DIAGNOSIS — R0600 Dyspnea, unspecified: Secondary | ICD-10-CM | POA: Diagnosis not present

## 2015-05-13 DIAGNOSIS — K5909 Other constipation: Secondary | ICD-10-CM | POA: Diagnosis not present

## 2015-05-13 DIAGNOSIS — C159 Malignant neoplasm of esophagus, unspecified: Secondary | ICD-10-CM | POA: Diagnosis present

## 2015-05-13 DIAGNOSIS — R109 Unspecified abdominal pain: Secondary | ICD-10-CM | POA: Diagnosis not present

## 2015-05-13 DIAGNOSIS — K869 Disease of pancreas, unspecified: Secondary | ICD-10-CM | POA: Diagnosis not present

## 2015-05-13 NOTE — Telephone Encounter (Signed)
per pof to sch pt appt-gave pt copy of avs-pt understood the time delay between MD appt and nut appt

## 2015-05-13 NOTE — Progress Notes (Signed)
[facility] OFFICE PROGRESS NOTE   Diagnosis: Esophagus cancer  INTERVAL HISTORY:   Julian Mcdonald returns as scheduled. He reports no bowel movement for the past 3 weeks despite MiraLAX, sorbitol, and fleets enemas. He has intermittent dry heaves, but no emesis. He continues to have upper abdominal pain.  Julian Mcdonald underwent a paracentesis for 1.1 L of fluid on 05/10/2015. The cytology returned positive for metastatic adenocarcinoma.  Objective:  Vital signs in last 24 hours:  Blood pressure 141/88, pulse 112, temperature 97.9 F (36.6 C), temperature source Oral, resp. rate 18, height 5' 8.5" (1.74 m), weight 234 lb 4.8 oz (106.278 kg), SpO2 97 %.     Resp: Lungs with decreased breath sounds and coarse rhonchi at the posterior bases, no respiratory distress Cardio: Regular rate and rhythm GI: Distended, tender in the upper abdomen, no mass Vascular: No leg edema Rectal: No stool in the rectal vault. No mass.      Imaging:  US Paracentesis  05/10/2015   INDICATION: Ascites, request for paracentesis.  EXAM: ULTRASOUND-GUIDED PARACENTESIS  COMPARISON:  CT 05/03/15.  MEDICATIONS: None.  COMPLICATIONS: None immediate  TECHNIQUE: Informed written consent was obtained from the patient after a discussion of the risks, benefits and alternatives to treatment. A timeout was performed prior to the initiation of the procedure.  Initial ultrasound scanning demonstrates a small amount of ascites within the right upper abdominal quadrant. The right upper abdomen was prepped and draped in the usual sterile fashion. 1% lidocaine was used for local anesthesia.  Under direct ultrasound guidance, a 19 gauge, 7-cm, Yueh catheter was introduced. An ultrasound image was saved for documentation purposed. The paracentesis was performed. The catheter was removed and a dressing was applied. The patient tolerated the procedure well without immediate post procedural complication.  FINDINGS: A  total of approximately 1.1 liters of serous fluid was removed. Samples were sent to the laboratory as requested by the clinical team.  IMPRESSION: Successful ultrasound-guided paracentesis yielding 1.1 liters of peritoneal fluid.\  Read By:  Tsosie Billing PA-C   Electronically Signed   By: Marybelle Killings M.D.   On: 05/10/2015 14:27    Medications: I have reviewed the patient's current medications.  Assessment/Plan: 1. Adenocarcinoma of the esophagus, distal esophagus mass noted at endoscopy 05/03/2015  Staging CTs 05/03/2015 revealed thickening at the GE junction, a pancreas body mass, abdominal/retroperitoneal lymphadenopathy, ascites, and peritoneal nodularity  Diagnostic/therapeutic paracentesis 05/10/2015 confirmed malignant cells consistent with metastatic adenocarcinoma   2. Iron deficiency anemia secondary to bleeding from #1  3. 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 Solid dysphagia secondary to #1  5. Anorexia/weight loss  6. Abdominal pain  7. Dyspnea  8.    Constipation-likely secondary to narcotic analgesia and ileus from carcinomatosis, low clinical suspicion for a focal bowel obstruction    Disposition:  Julian Mcdonald has been diagnosed with metastatic adenocarcinoma involving abdominal ascites. 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 The patient and family understand it is possible he has a pancreatic cancer primary.  We discussed treatment options. No therapy will be curative. We discussed the expected partial response rate associated with systemic chemotherapy in this setting. He has a poor performance status. I recommend hospice care. Julian Mcdonald and his family will discuss the Home Hospice option and let us know his decision.  He has been constipated for the past 3 weeks. Multiple laxity regimens have not helped. I contacted Dr. Benson Norway  and he recommended a course of  high-dose MiraLAX/Gatorade to be given today. Julian Mcdonald will contact Dr. Benson Norway if this does not relieve the constipation.  He will return for an office visit 05/18/2015.  Betsy Coder, MD  05/13/2015  12:45 PM

## 2015-05-16 ENCOUNTER — Ambulatory Visit: Payer: Medicare Other | Admitting: Oncology

## 2015-05-17 ENCOUNTER — Telehealth: Payer: Self-pay | Admitting: *Deleted

## 2015-05-17 MED ORDER — OXYCODONE HCL 5 MG PO TABS: 5.0000 mg | ORAL_TABLET | ORAL | Status: AC | PRN

## 2015-05-17 NOTE — Telephone Encounter (Signed)
Spouse called asking "Can Dr. Benay Spice order something stronger for Julian Mcdonald's pain.  Today the pain to his back on the right side is hurting so bad he could hardly get in and out of the shpwer.  Pain has been present since he found out he has cancer.  Takes one hydrocodone-acetaminophen 5-325 mg every four to five hours.  Today he isn't getting any relief.  Using a heating pad.  Denies constipation stating he takes sorbitol twice daily and doesn't eat.  Yesterday he ate mashed potatoes.  No pro lems emptying his bladder."   Asked that we call home number [phone].  "After 4:00 pm I'm making him go with me to my daughters so he is not just lying around and we can be reached via mobile number [phone].  Instructed to come pick up prescription if ordered by 4:00 pm.

## 2015-05-17 NOTE — Telephone Encounter (Signed)
Dr. Benay Spice has written new prescription: Oxycodone 5mg  1-2 tabs every 4 hrs as needed for pain. Wife made aware that script is ready for pickup and she will need her ID to obtain script. She voices understanding.

## 2015-05-18 ENCOUNTER — Encounter: Payer: Self-pay | Admitting: Nutrition

## 2015-05-18 ENCOUNTER — Other Ambulatory Visit: Payer: Self-pay | Admitting: *Deleted

## 2015-05-18 ENCOUNTER — Telehealth: Payer: Self-pay | Admitting: *Deleted

## 2015-05-18 ENCOUNTER — Encounter: Payer: Medicare Other | Admitting: Nutrition

## 2015-05-18 ENCOUNTER — Institutional Professional Consult (permissible substitution): Payer: Medicare Other | Admitting: Internal Medicine

## 2015-05-18 ENCOUNTER — Ambulatory Visit: Payer: Medicare Other | Admitting: Nurse Practitioner

## 2015-05-18 NOTE — Telephone Encounter (Signed)
Received message from wife Silva Bandy stating that pt cannot come in for appt today 05/18/15.  Spoke with Silva Bandy, and was informed that pt was hurting quite much in his lower back, cannot walk far - from chair to bed.  Pt has been taking only 1 Oxycodone every 4 hours as needed for pain.  Script stated 1 - 2 tabs every 4 hours as needed. Informed wife that pt can take 2 tabs if pt has that much pain; however, pt needs to monitor for constipation.  Wife voiced understanding. Per wife, pt would like to reschedule appt with Dr. Benay Spice to next week.  POF sent to scheduler. Silva Bandy'   Phone     [phone].

## 2015-05-18 NOTE — Telephone Encounter (Signed)
8/24 12PM

## 2015-05-18 NOTE — Progress Notes (Signed)
Wife called to cancel nutrition appointment.  States husband is "stuck in his chair" and cannot walk to the car.

## 2015-05-19 ENCOUNTER — Telehealth: Payer: Self-pay | Admitting: Oncology

## 2015-05-19 ENCOUNTER — Ambulatory Visit: Payer: Medicare Other | Admitting: Internal Medicine

## 2015-05-19 NOTE — Progress Notes (Unsigned)
   Subjective:    Patient ID: Julian Mcdonald, male    DOB: [birth year], 74 y.o.   MRN: [id number]  HPI    Review of Systems     Objective:   Physical Exam        Assessment & Plan:

## 2015-05-19 NOTE — Telephone Encounter (Signed)
S/w pt's wife confirming MD visit per 08/17 POF.... Cherylann Banas

## 2015-05-25 ENCOUNTER — Ambulatory Visit: Payer: Medicare Other | Admitting: Oncology

## 2015-05-25 ENCOUNTER — Telehealth: Payer: Self-pay | Admitting: *Deleted

## 2015-05-25 NOTE — Telephone Encounter (Signed)
  Oncology Nurse Navigator Documentation    Navigator Encounter Type: Telephone (05/25/15 1703): Called wife to follow up on "no show" today. She reports she called earlier and left a voice mail that he would not make the appointment today. They have decided to seek 2nd opinion at [facility] tomorrow. She will call back after they have made their decision. Still having pain; getting weaker; bowels not moving much and when they do it is loose. She has spoken with Dr. Benson Norway about this. Wished them a good visit tomorrow and made her aware all we want for [place] is the best care that is effective for him. Want him comfortable above all. She appreciates the call.

## 2015-06-01 ENCOUNTER — Telehealth: Payer: Self-pay | Admitting: *Deleted

## 2015-06-01 NOTE — Telephone Encounter (Signed)
  Oncology Nurse Navigator Documentation    Navigator Encounter Type: Telephone (06/01/15 1159)  Called home # to follow up on status of his 2nd Opinion at [facility]. Was told by family member that he was admitted that day and died in the hospital on 20-Jun-2015 with renal failure. Expressed our condolences for their loss. Requested she let wife know the office called to check on him. Please call if there is anything we can do to assist.

## 2015-06-02 DEATH — deceased

## 2015-06-07 ENCOUNTER — Encounter: Payer: Self-pay | Admitting: Oncology

## 2015-06-07 NOTE — Progress Notes (Signed)
I placed fmla forms for Julian Mcdonald on desk of nurse for dr. Benay Spice for [place]. The patient is deceased. See prev notes.

## 2015-06-10 ENCOUNTER — Encounter: Payer: Self-pay | Admitting: *Deleted

## 2015-06-10 NOTE — Progress Notes (Signed)
FMLA papers for Julian Mcdonald taken to Ms. [place] desk. Called [place], Stacy's mother (no number on file for [place]) and let her know FMLA papers were ready for pickup. She voiced understanding to pass this information along.

## 2015-07-18 NOTE — Progress Notes (Unsigned)
   Subjective:    Patient ID: Julian Mcdonald [place], male    DOB: 11/04/1940, 74 y.o.   MRN: [id number]  HPI    Review of Systems     Objective:   Physical Exam        Assessment & Plan:   

## 2017-03-13 IMAGING — CR DG CHEST 2V
2 series · 2 of 2 positions shown · non-contrast
Comparison: 03/12/2014.

CLINICAL DATA: Shortness of breath.  Weakness.

EXAM:
CHEST  2 VIEW

[view not recorded (1 of 2)]
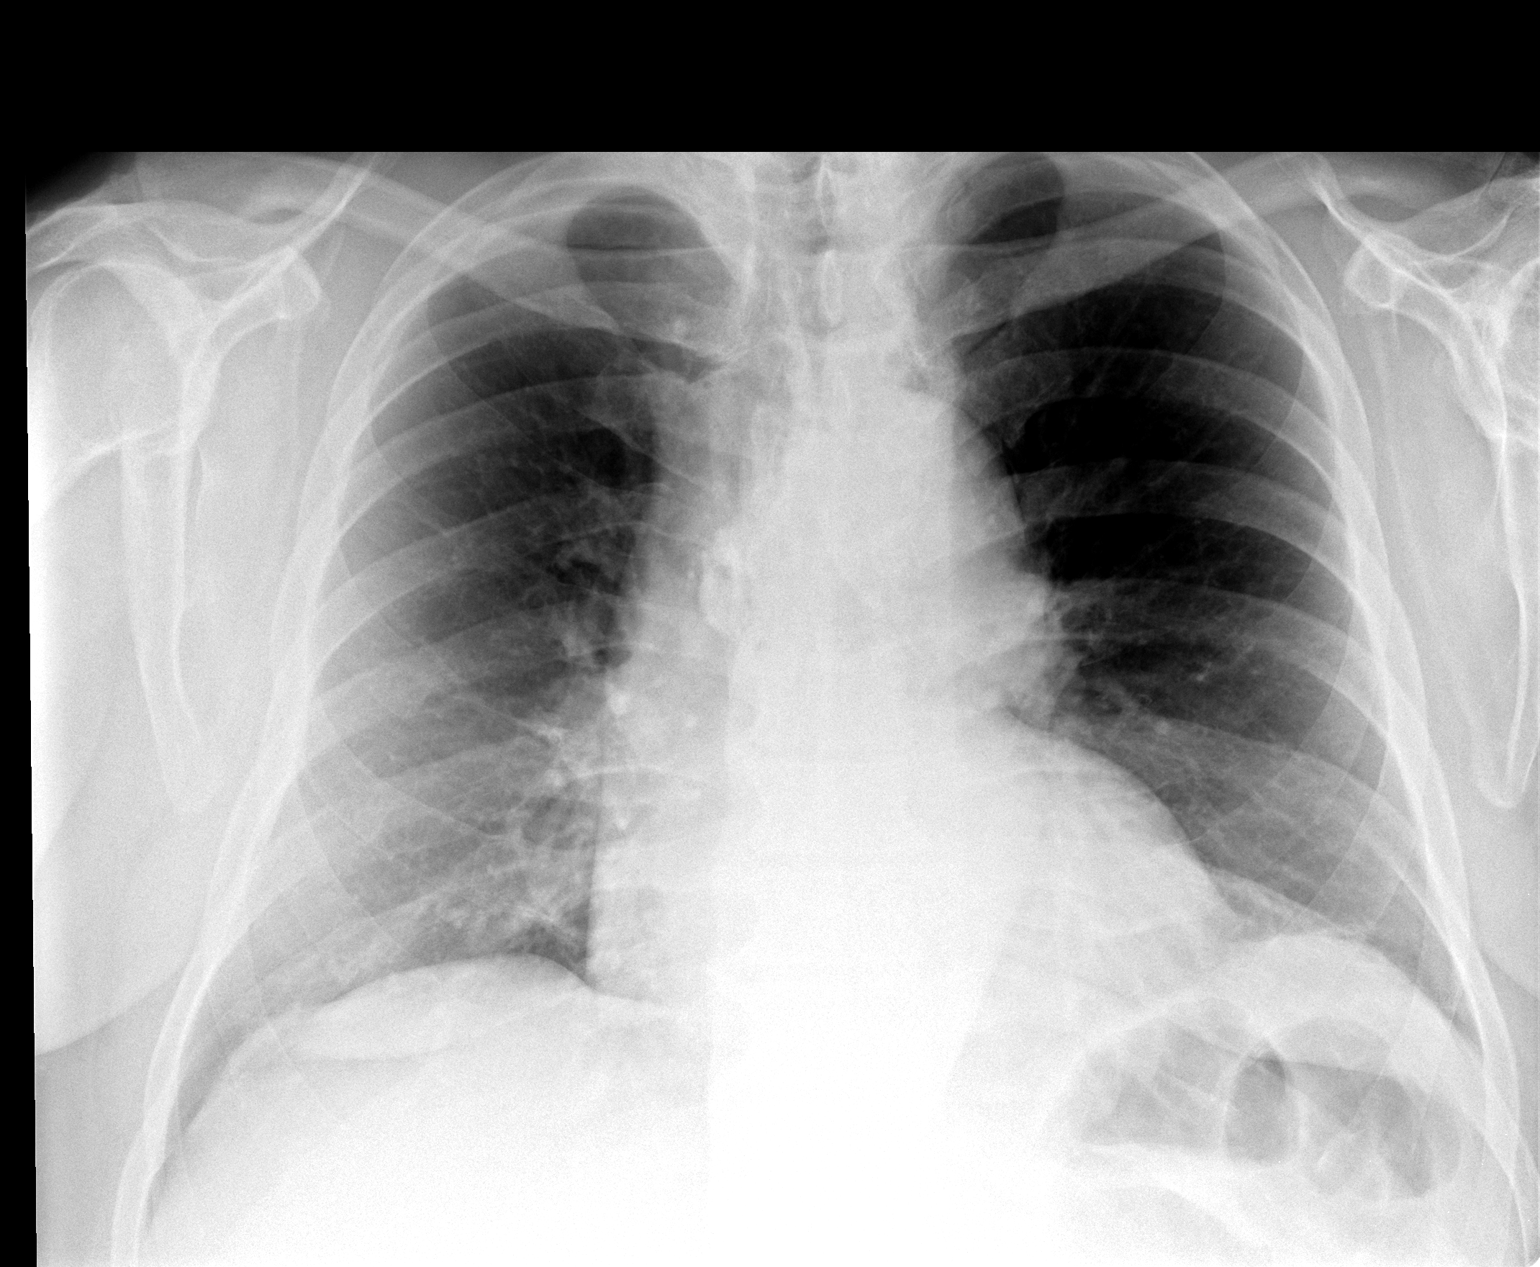

[view not recorded (2 of 2)]
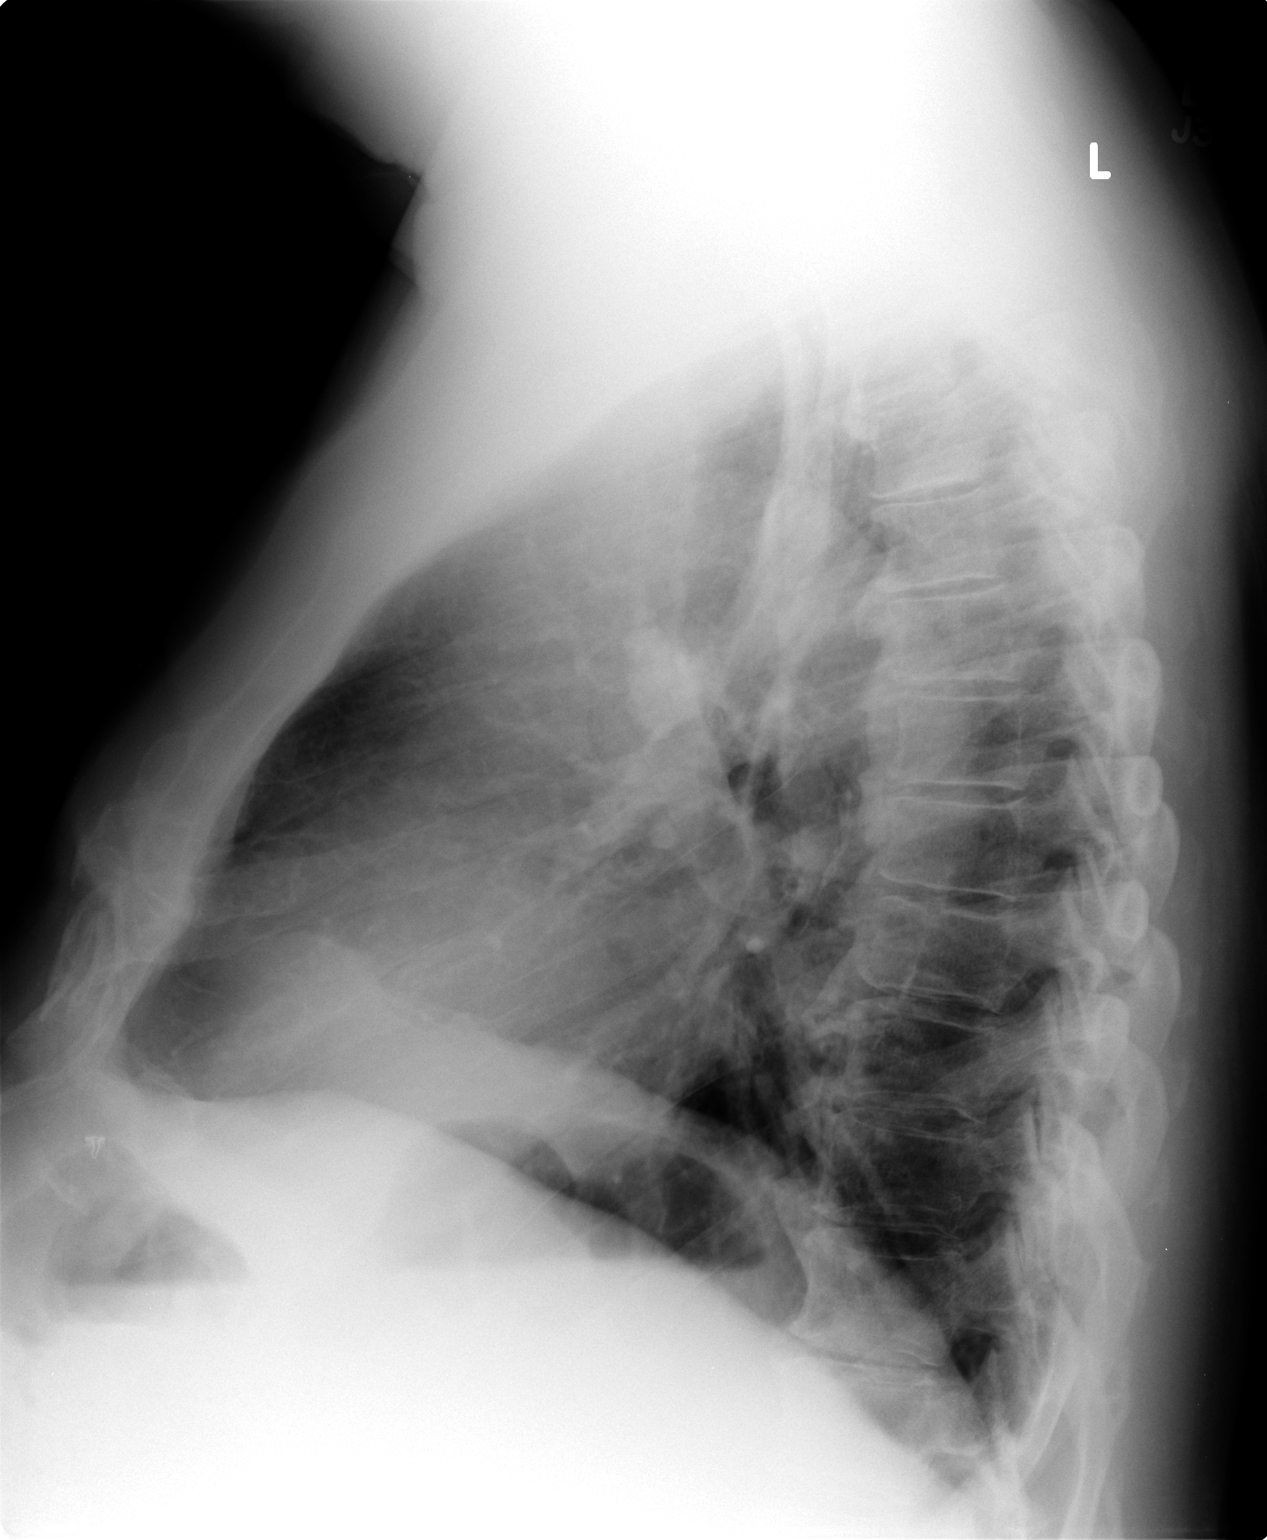

[2 of 2 positions shown; findings below may reference images not displayed]

FINDINGS: Mediastinum hilar structures normal. Stable cardiomegaly with normal
pulmonary vascularity. Stable calcified hilar lymph nodes. Low lung
volumes with mild basilar atelectasis. No pleural effusion or
pneumothorax.
IMPRESSION: 1.  Low lung volumes with mild basilar subsegmental atelectasis.

2.  Stable cardiomegaly.

## 2017-04-22 IMAGING — CT CT ABD-PELV W/ CM
2 of 5 series · 15 of 46 positions shown, 17 images · IV contrast (omnipaque)
Comparison: None.

CLINICAL DATA: Patient with new esophageal mass identified on
endoscopy. Staging for esophageal cancer.

EXAM:
CT CHEST, ABDOMEN, AND PELVIS WITH CONTRAST
TECHNIQUE: Multidetector CT imaging of the chest, abdomen and pelvis was
performed following the standard protocol during bolus
administration of intravenous contrast.
CONTRAST:  100mL OMNIPAQUE IOHEXOL 300 MG/ML  SOLN

[Series 2: cap with st · axial · 0.95mm/px · z∈[-645,-50]mm · 12 of 135 slices shown, 14 images]
[im 8/135  soft-tissue]
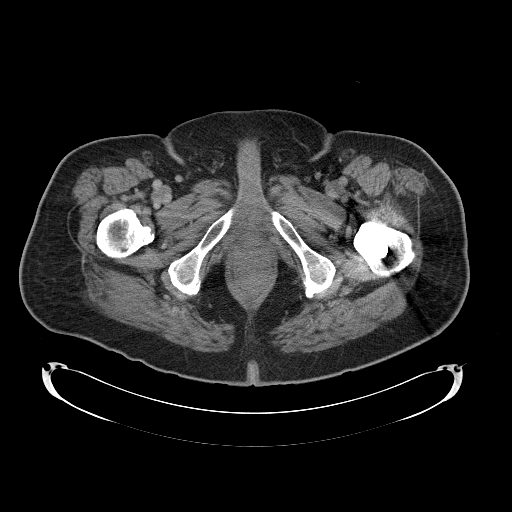
[im 8/135  bone]
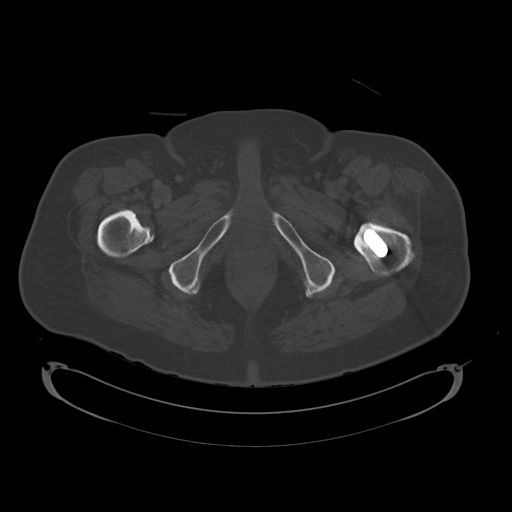
[im 22/135  soft-tissue]
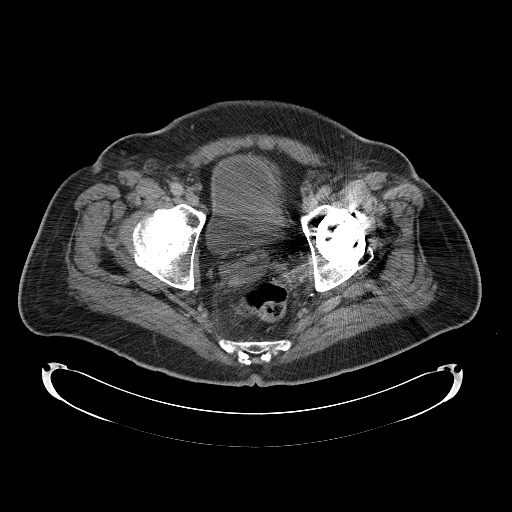
[im 29/135  soft-tissue]
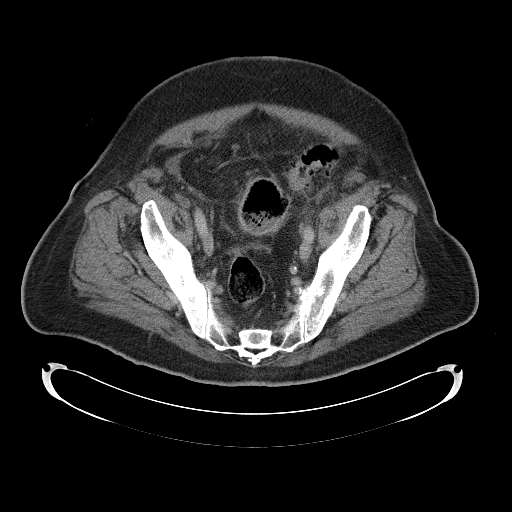
[im 43/135  soft-tissue]
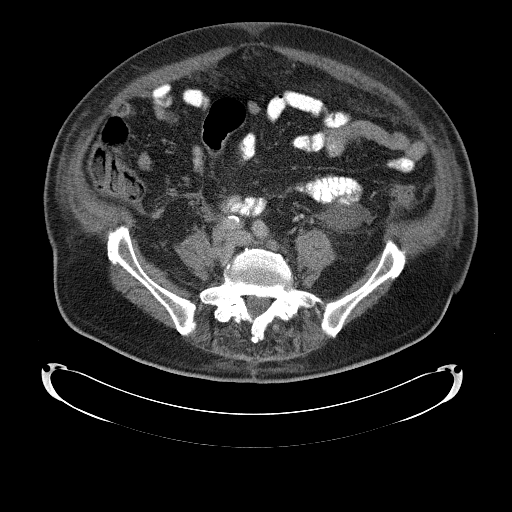
[im 50/135  soft-tissue]
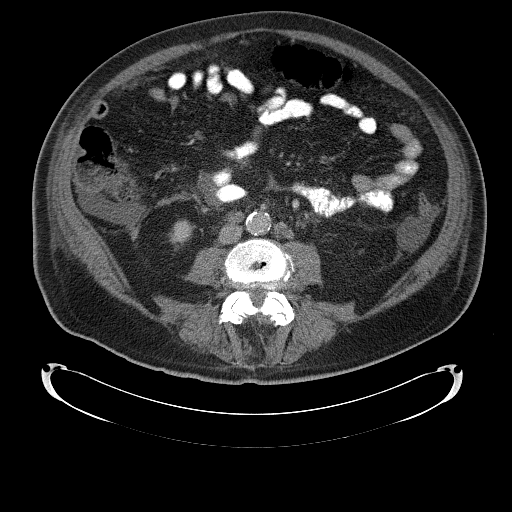
[im 64/135  soft-tissue]
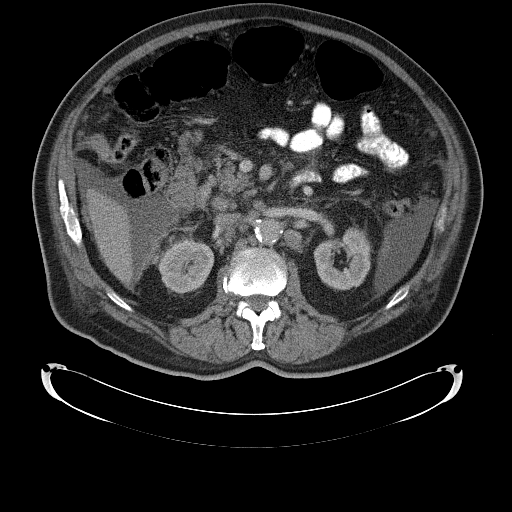
[im 71/135  soft-tissue]
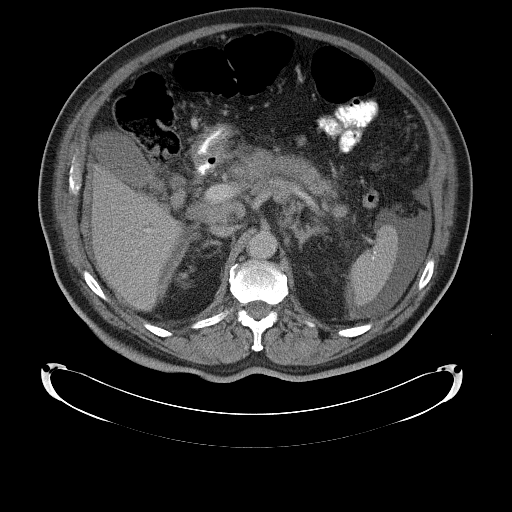
[im 85/135  soft-tissue]
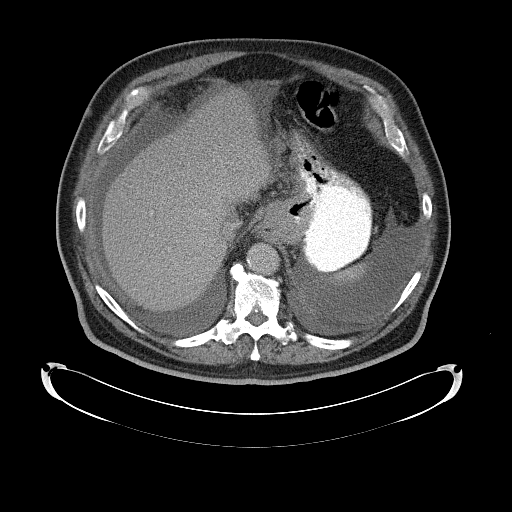
[im 92/135  soft-tissue]
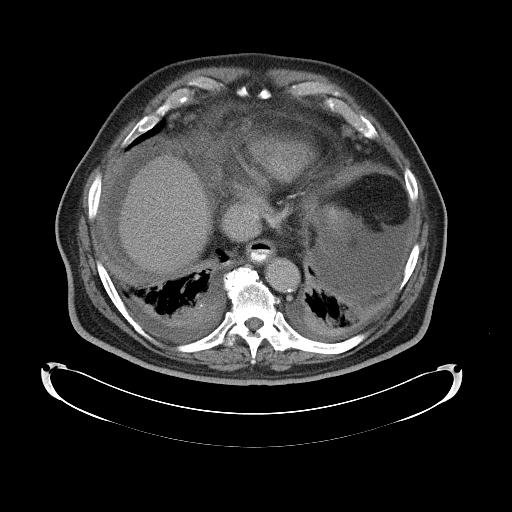
[im 92/135  bone]
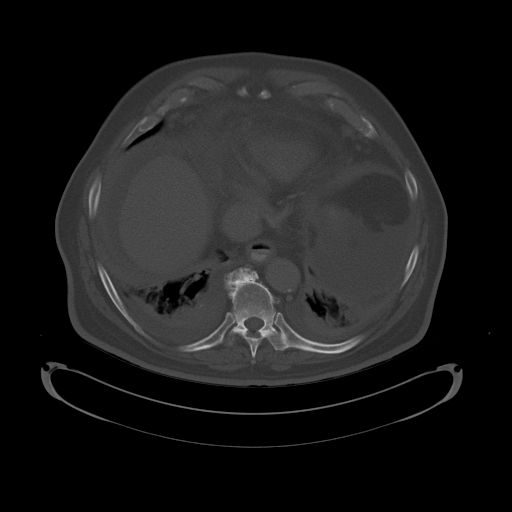
[im 106/135  soft-tissue]
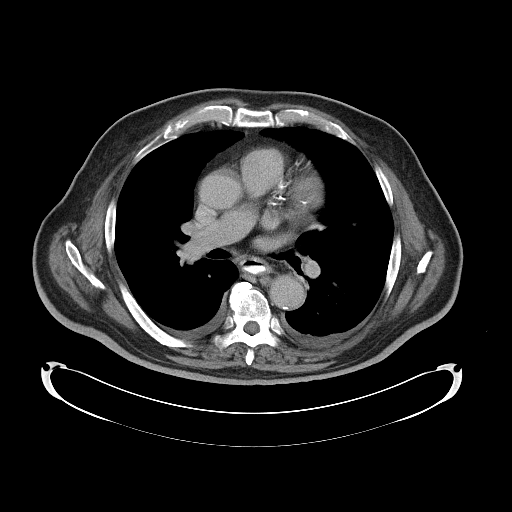
[im 113/135  soft-tissue]
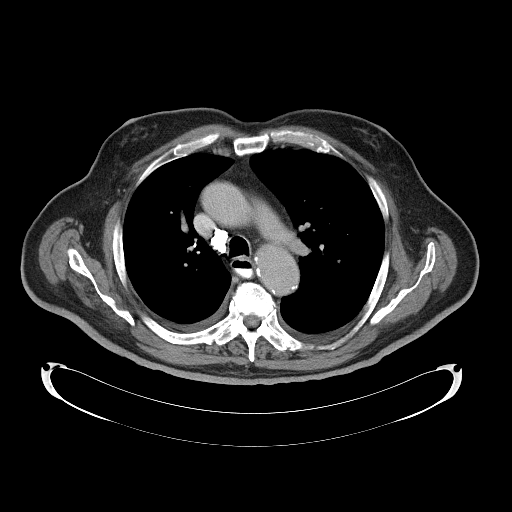
[im 127/135  soft-tissue]
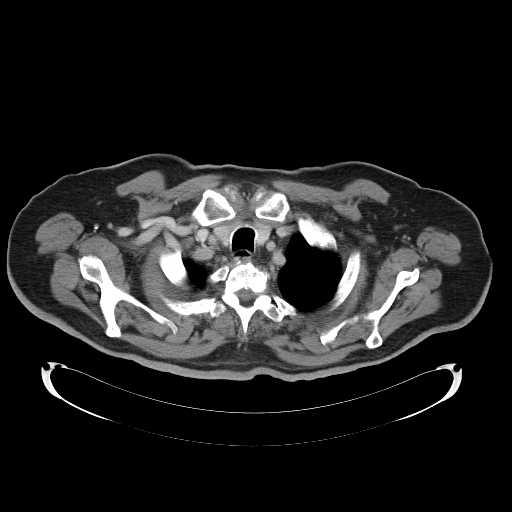

[Series 602: coronal iamges · coronal · 1.31mm/px · 3 of 122 slices shown]
[im 41/122  soft-tissue]
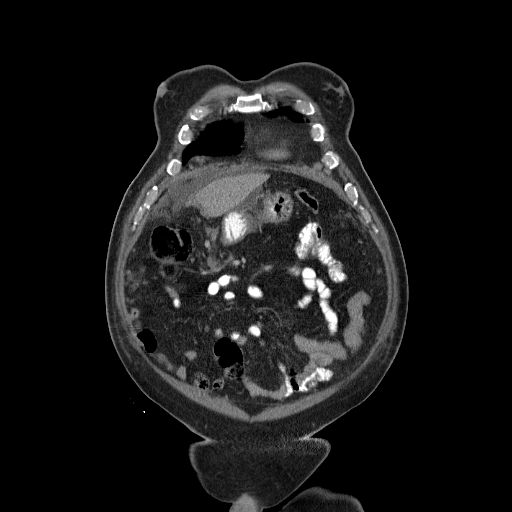
[im 54/122  soft-tissue]
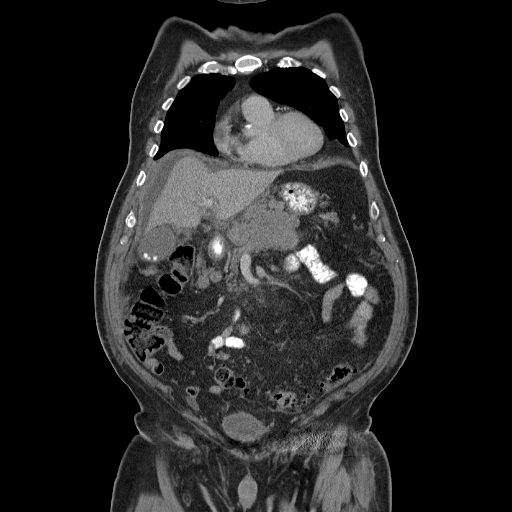
[im 68/122  soft-tissue]
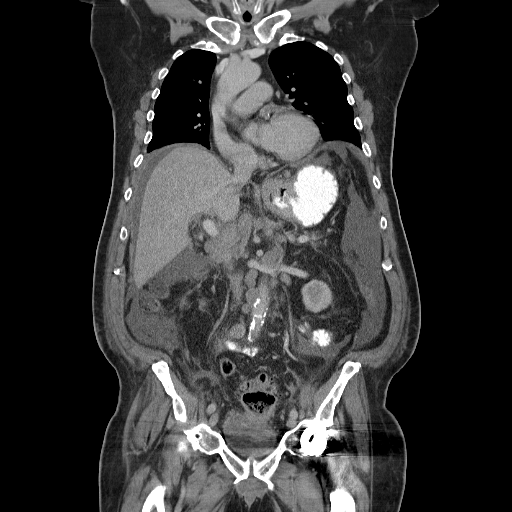

[15 of 46 positions shown; findings below may reference images not displayed]

FINDINGS: CT CHEST FINDINGS

Mediastinum/Nodes: Visualized thyroid is unremarkable. No enlarged
axillary, mediastinal or hilar lymphadenopathy. There is a calcified
1.1 cm right peritracheal lymph node (image 25; series 2). There is
a 1.3 cm node adjacent to the left aspect of the esophagus (image
32; series 2). Coronary arterial vascular calcifications. Oral
contrast material within the esophagus. Multiple pericardial phrenic
lymph nodes are demonstrated including a 10 mm node (image 42;
series 2).

Lungs/Pleura: Central airways are patent. There are small bilateral
pleural effusions and dependent ground-glass and consolidative
opacities within the bilateral lower lobes. There is a 9 mm
calcified pulmonary nodule within the right upper lobe. No
pneumothorax.

CT ABDOMEN AND PELVIS FINDINGS

Hepatobiliary: Liver is normal in size and contour. Mild
intrahepatic biliary ductal dilatation. Multiple calcified stones
within the gallbladder lumen.

Pancreas: There is 6.2 x 8.4 cm soft tissue mass involving the
majority of the pancreatic body. No definite pancreatic ductal
dilatation.

Spleen: Multiple calcified granulomata within the spleen.

Adrenals/Urinary Tract: Normal adrenal glands. Kidneys enhance
symmetrically with contrast. No hydronephrosis. Urinary bladder is
unremarkable.

Stomach/Bowel: Sigmoid colonic diverticulosis. There is a suspected
giant diverticulum off of the sigmoid colon measuring up to 5 cm
(image 108; series 2). No evidence for bowel obstruction. There is
marked wall thickening and irregularity of the distal esophagus at
the level of the GE junction.

Vascular/Lymphatic: Normal caliber abdominal aorta with peripheral
calcified atherosclerotic plaque. Multiple enlarged retroperitoneal
lymph nodes are demonstrated with a reference left periaortic lymph
node measuring 2.5 cm (image 68; series 2), a more inferior left
periaortic lymph node measuring 2.4 cm (image 79; series 2) and an
aortic caval lymph node measuring 1.8 cm (image 80; series 2). There
multiple enlarged porta hepatic lymph nodes measuring up to 2.7 cm
(image 63; series 2). Multiple enlarged gastrohepatic lymph nodes
including a 2.3 cm node (image 55; series 2).

Other: Moderate amount of ascites. There are multiple enhancing
nodule along the peritoneum best visualized within upper abdomen.
Additionally there is suspected nodularity involving the omentum
(image 79; series 2).

Musculoskeletal: Patient status post left hip arthroplasty. No
definite aggressive or acute appearing osseous lesions. SI joint and
right hip joint degenerative changes. Lower lumbar spine
degenerative changes.
IMPRESSION: 1. Marked wall thickening and irregularity at the GE junction
concerning for primary distal esophageal malignancy.
2. There is a large (8.4 cm) mass involving the pancreatic body
which may represent metastatic disease in the setting probable
esophageal carcinoma or potentially primary pancreatic malignancy.
3. Multiple enlarged gastrohepatic, porta hepatic and
retroperitoneal lymph nodes most compatible with metastatic disease.
4. Moderate amount of ascites as well as peritoneal nodularity and
omental nodularity compatible with peritoneal carcinomatosis.
5. Bilateral pleural effusions. Dependent ground-glass and
consolidative opacities most compatible with atelectasis.
6. Suspected giant diverticulum off of the sigmoid colon with
surrounding fluid which is likely representative of ascites.
Recommend continued clinical correlation for the possibility of
lower abdominal pain.
7. Cholelithiasis.
These results will be called to the ordering clinician or
representative by the Radiologist Assistant, and communication
documented in the PACS or zVision Dashboard.

## 2017-04-29 IMAGING — US US PARACENTESIS
1 series · 10 of 10 positions shown · non-contrast
Comparison: CT 05/03/15.

MEDICATIONS:
None.

COMPLICATIONS:
None immediate

INDICATION: Ascites, request for paracentesis.

EXAM:
ULTRASOUND-GUIDED PARACENTESIS
TECHNIQUE: Informed written consent was obtained from the patient after a
discussion of the risks, benefits and alternatives to treatment. A
timeout was performed prior to the initiation of the procedure.

[Series 1: us paracentesis · 0.31mm/px · 10 of 10 slices shown]
[im 1/10]
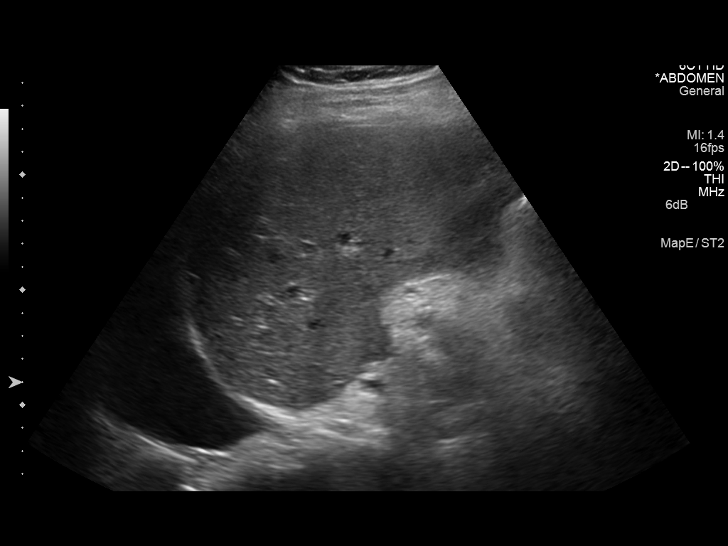
[im 2/10]
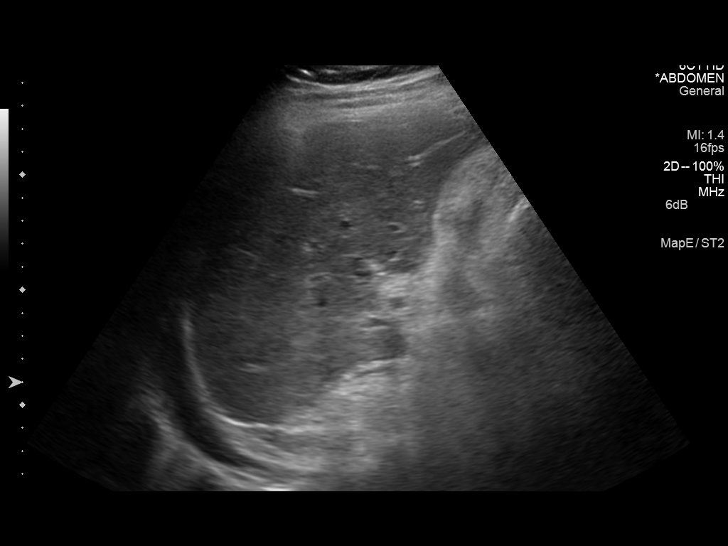
[im 3/10]
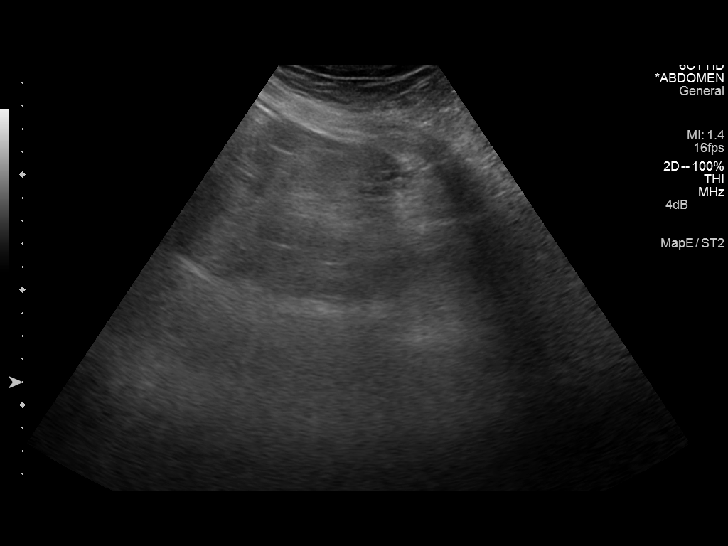
[im 4/10]
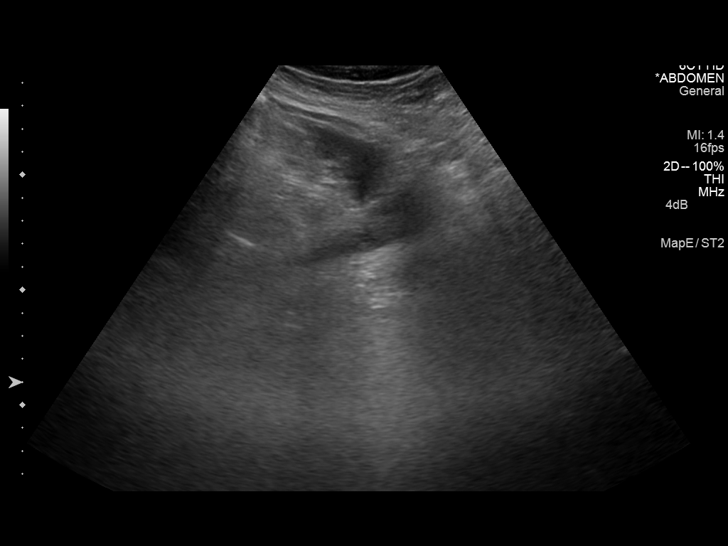
[im 5/10]
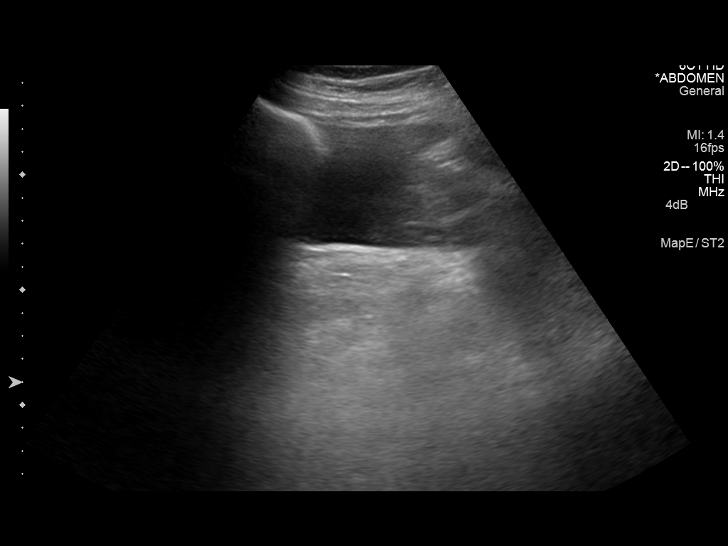
[im 6/10]
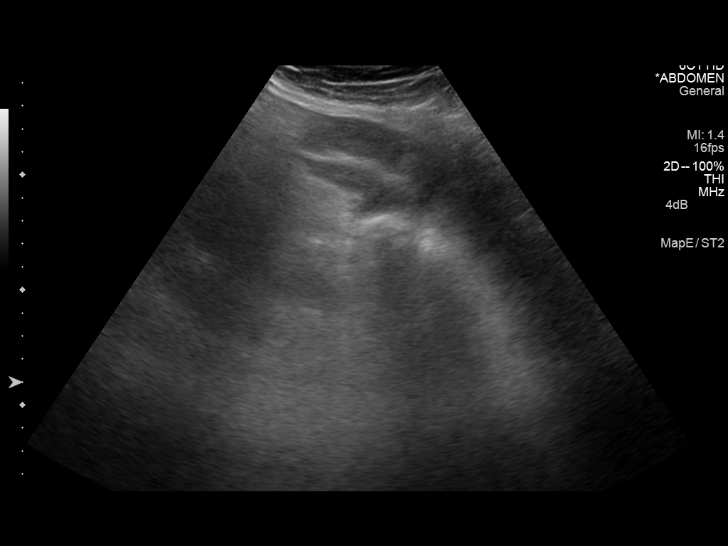
[im 7/10]
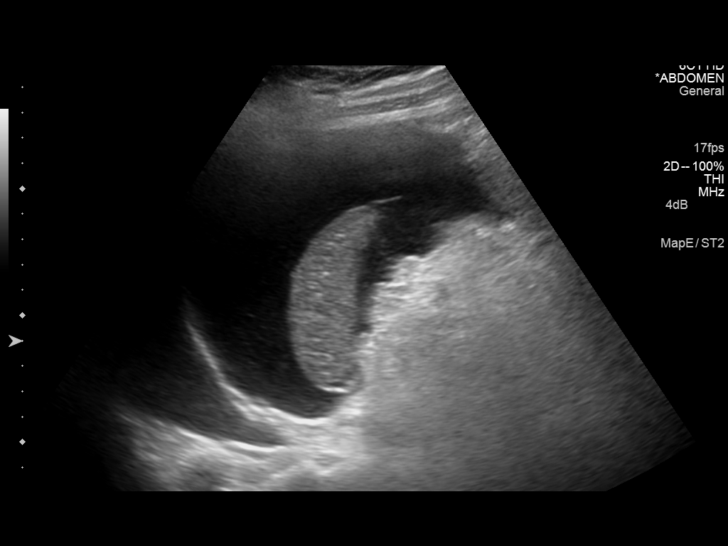
[im 8/10]
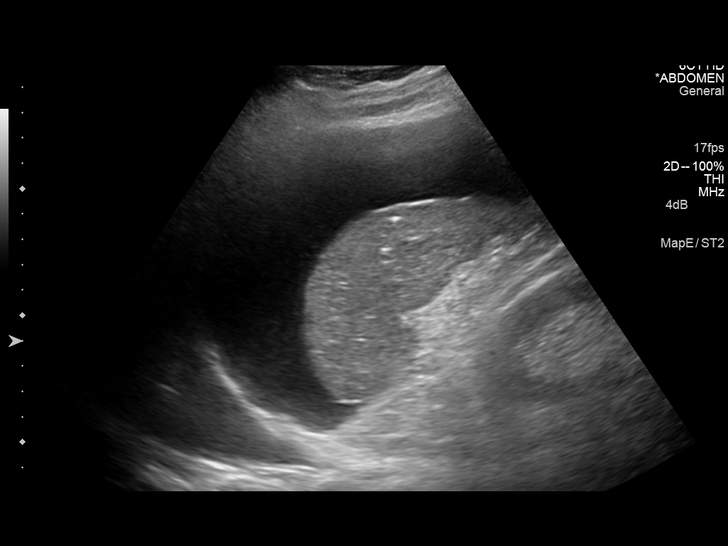
[im 9/10]
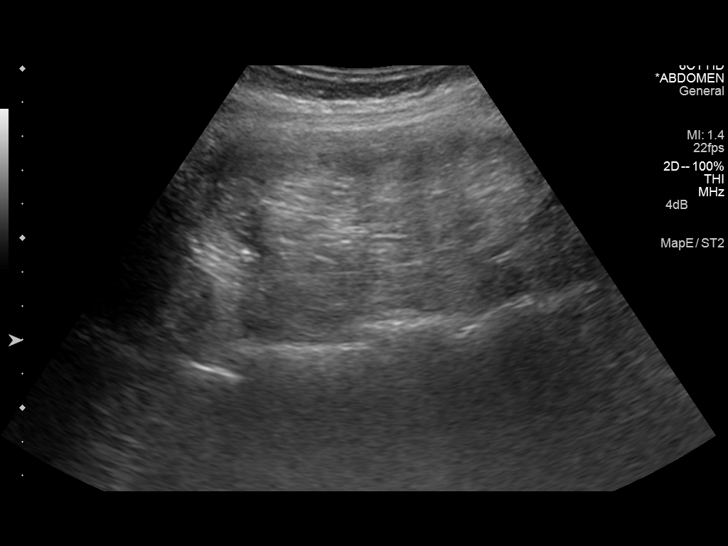
[im 10/10]
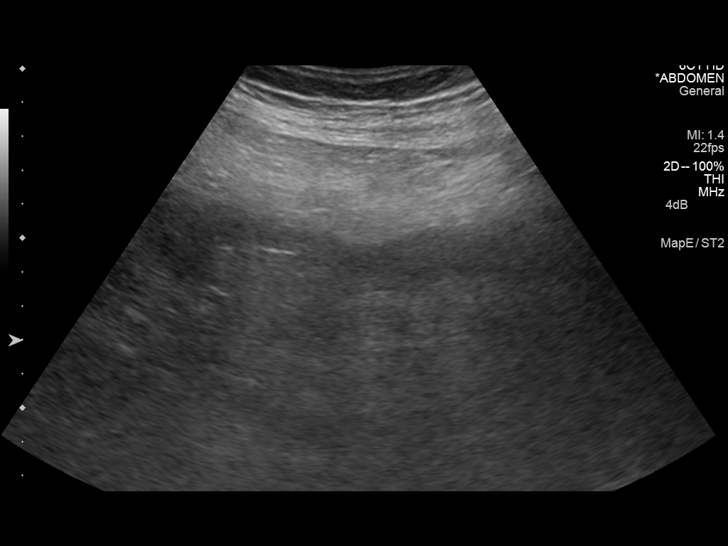

[10 of 10 positions shown; findings below may reference images not displayed]

Initial ultrasound scanning demonstrates a small amount of ascites
within the right upper abdominal quadrant. The right upper abdomen
was prepped and draped in the usual sterile fashion. 1% lidocaine
was used for local anesthesia.

Under direct ultrasound guidance, a 19 gauge, 7-cm, Yueh catheter
was introduced. An ultrasound image was saved for documentation
purposed. The paracentesis was performed. The catheter was removed
and a dressing was applied. The patient tolerated the procedure well
without immediate post procedural complication.
FINDINGS: A total of approximately 1.1 liters of serous fluid was removed.
Samples were sent to the laboratory as requested by the clinical
team.
IMPRESSION: Successful ultrasound-guided paracentesis yielding 1.1 liters of
peritoneal fluid.
